# Patient Record
Sex: Male | Born: 1980 | Race: White | Hispanic: No | Marital: Married | Smoking: Never smoker
Health system: Southern US, Community
[De-identification: ages and names within clinical notes are randomized; demographics above are authoritative.]

## PROBLEM LIST (undated history)

## (undated) DIAGNOSIS — R569 Unspecified convulsions: Secondary | ICD-10-CM

---

## 2001-01-18 ENCOUNTER — Ambulatory Visit (HOSPITAL_BASED_OUTPATIENT_CLINIC_OR_DEPARTMENT_OTHER): Admission: RE | Admit: 2001-01-18 | Discharge: 2001-01-18 | Payer: Self-pay | Admitting: *Deleted

## 2005-10-28 ENCOUNTER — Emergency Department: Payer: Self-pay | Admitting: Emergency Medicine

## 2007-09-05 ENCOUNTER — Emergency Department: Payer: Self-pay | Admitting: Emergency Medicine

## 2012-08-01 ENCOUNTER — Emergency Department: Payer: Self-pay | Admitting: *Deleted

## 2012-08-01 LAB — CK TOTAL AND CKMB (NOT AT ARMC)
CK, Total: 171 U/L (ref 35–232)
CK-MB: 1.5 ng/mL (ref 0.5–3.6)

## 2012-08-01 LAB — BASIC METABOLIC PANEL
BUN: 12 mg/dL (ref 7–18)
Calcium, Total: 9.2 mg/dL (ref 8.5–10.1)
Chloride: 104 mmol/L (ref 98–107)
Creatinine: 1.1 mg/dL (ref 0.60–1.30)
EGFR (African American): >60
EGFR (Non-African Amer.): >60
Glucose: 79 mg/dL (ref 65–99)
Osmolality: 276 (ref 275–301)
Potassium: 3.8 mmol/L (ref 3.5–5.1)
Sodium: 139 mmol/L (ref 136–145)

## 2012-08-01 LAB — CBC
MCH: 31.3 pg (ref 26.0–34.0)
MCV: 88 fL (ref 80–100)
Platelet: 260 10*3/uL (ref 150–440)
RBC: 4.94 10*6/uL (ref 4.40–5.90)
RDW: 13.2 % (ref 11.5–14.5)

## 2019-12-22 ENCOUNTER — Other Ambulatory Visit: Payer: Self-pay

## 2019-12-22 ENCOUNTER — Ambulatory Visit
Admission: EM | Admit: 2019-12-22 | Discharge: 2019-12-22 | Disposition: A | Discharge status: Home / Self Care | Payer: Managed Care, Other (non HMO)

## 2019-12-22 DIAGNOSIS — R21 Rash and other nonspecific skin eruption: Secondary | ICD-10-CM | POA: Diagnosis not present

## 2019-12-22 DIAGNOSIS — B372 Candidiasis of skin and nail: Secondary | ICD-10-CM

## 2019-12-22 MED ORDER — NYSTATIN-TRIAMCINOLONE 100000-0.1 UNIT/GM-% EX CREA: TOPICAL_CREAM | CUTANEOUS | 0 refills | Status: DC

## 2019-12-22 NOTE — ED Triage Notes (Signed)
Pt presents with c/o rash in the groin and inner thigh area. He states symptoms have been present for about 6 weeks. He states he has tried OTC antifungal creams with some improvement. He denies any other symptoms such as urinary. He denies any oozing or blisters. He does report the rash is more prominent and sensitive after he has been active and gotten hot. He states the area itches and burns.

## 2019-12-22 NOTE — Discharge Instructions (Addendum)
It was very nice seeing you today in clinic. Thank you for entrusting me with your care.   Use prescribed cream as discussed. During the day, would also suggest using Gold Bond Powder to ensure that groin is staying as dry as possible.   Make arrangements to follow up with your regular doctor in 1 week for re-evaluation if not improving. If your symptoms/condition worsens, please seek follow up care either here or in the ER. Please remember, our [facility] providers are "right here with you" when you need [place].   Again, it was my pleasure to take care of you today. Thank you for choosing our clinic. I hope that you start to feel better quickly.   Quentin Mulling, MSN, APRN, FNP-C, CEN Advanced Practice Provider  [facility]

## 2019-12-23 NOTE — ED Provider Notes (Signed)
[place], [place]   Name: Jeffery Cooper DOB: 1981/08/22 MRN: [id number] CSN: [id number] PCP: Patient, No Pcp Per  Arrival date and time:  12/22/19 1202  Chief Complaint:  Rash   NOTE: Prior to seeing the patient today, I have reviewed the triage nursing documentation and vital signs. Clinical staff has updated patient's PMH/PSHx, current medication list, and drug allergies/intolerances to ensure comprehensive history available to assist in medical decision making.   History:   HPI: Jeffery Cooper is a 39 y.o. male who presents today with complaints of rash to his groin that initially declared around the first week in December. Rash is reported to be in the creases of his legs BILATERALLY. He notes that the rash is erythematous, pruritic, and "burns" when wet. He endorses that due to his job, his groin area is consistently moist. He is required to wear pants that he reports "do not breathe". Rash is improved in the evenings after bathing, however when he is active/gets hot, the rash worsens again to the point where it causes him pain when he walks. In efforts to conservatively manage his symptoms at home, the patient notes that he has used Lortimin, which is reported to only minimally help to improve his symptoms.   History reviewed. No pertinent past medical history.  History reviewed. No pertinent surgical history.  Family History  Problem Relation Age of Onset  . Clotting disorder Mother   . Hypertension Father   . Stroke Father   . Heart attack Father     Social History   Tobacco Use  . Smoking status: Never Smoker  . Smokeless tobacco: Never Used  Substance Use Topics  . Alcohol use: Not Currently  . Drug use: Never    There are no problems to display for this patient.   Home Medications:    Current Meds  Medication Sig  . cetirizine (ZYRTEC) 10 MG tablet Take 10 mg by mouth daily.    Allergies:   Patient has no known allergies.  Review of Systems  (ROS): Review of Systems  Constitutional: Negative for chills and fever.  Respiratory: Negative for cough and shortness of breath.   Cardiovascular: Negative for chest pain and palpitations.  Genitourinary: Negative for discharge, dysuria, flank pain, frequency, genital sores, hematuria, penile pain, penile swelling, scrotal swelling, testicular pain and urgency.  Skin: Positive for color change and rash. Negative for pallor.  All other systems reviewed and are negative.    Vital Signs: Today's Vitals   12/22/19 1222 12/22/19 1225 12/22/19 1313  BP:  117/82   Pulse:  75   Temp:  98.5 F (36.9 C)   TempSrc:  Oral   SpO2:  98%   Weight: 240 lb (108.9 kg)    Height: 5\' 11"  (1.803 m)    PainSc: 0-No pain  0-No pain    Physical Exam: Physical Exam  Constitutional: He is oriented to person, place, and time and well-developed, well-nourished, and in no distress.  HENT:  Head: Normocephalic and atraumatic.  Mouth/Throat: Mucous membranes are normal.  Eyes: Pupils are equal, round, and reactive to light.  Cardiovascular: Normal rate, regular rhythm, normal heart sounds and intact distal pulses.  Pulmonary/Chest: Effort normal and breath sounds normal. No respiratory distress. He has no wheezes. He has no rales.  Musculoskeletal:     Cervical back: Normal range of motion and neck supple.  Neurological: He is alert and oriented to person, place, and time. Gait normal.  Skin: Skin is warm and  dry. Rash noted.  Intertriginous skin irritation noted to groin and beneath scrotum. No satellite lesions. Skin significantly erythematous and TTP. No drainage.   Psychiatric: Mood, memory, affect and judgment normal.  Nursing note and vitals reviewed.   Urgent Care Treatments / Results:   No orders of the defined types were placed in this encounter.   LABS: PLEASE NOTE: all labs that were ordered this encounter are listed, however only abnormal results are displayed. Labs Reviewed - No data  to display  EKG: -None  RADIOLOGY: No results found.  PROCEDURES: Procedures  MEDICATIONS RECEIVED THIS VISIT: Medications - No data to display  PERTINENT CLINICAL COURSE NOTES/UPDATES:   Initial Impression / Assessment and Plan / Urgent Care Course:  Pertinent labs & imaging results that were available during my care of the patient were personally reviewed by me and considered in my medical decision making (see lab/imaging section of note for values and interpretations).  Jeffery Cooper is a 39 y.o. male who presents to [facility] today with complaints of Rash   Patient is well appearing overall in clinic today. He does not appear to be in any acute distress. Presenting symptoms (see HPI) and exam as documented above. Exam consistent with intertriginous candidiasis. Skin very very inflamed and causing patient pain with ambulation. Discussed etiology as being related to moisture. Encouraged patient to use Gold Bond powder in efforts to promote dryness in the groin area. Discussed consideration of changing type of underwear that he wears to something that will prevent skin to skin contact. Will treat acute rash and irritation with antifungal/steroid topical (mycolog II) BID until healed.   Discussed follow up with primary care physician in 1 week for re-evaluation if not improving. I have reviewed the follow up and strict return precautions for any new or worsening symptoms. Patient is aware of symptoms that would be deemed urgent/emergent, and would thus require further evaluation either here or in the emergency department. At the time of discharge, he verbalized understanding and consent with the discharge plan as it was reviewed with him. All questions were fielded by provider and/or clinic staff prior to patient discharge.    Final Clinical Impressions / Urgent Care Diagnoses:   Final diagnoses:  Intertriginous candidiasis    New Prescriptions:  [place] Controlled Substance  Registry consulted? Not Applicable  Meds ordered this encounter  Medications  . nystatin-triamcinolone (MYCOLOG II) cream    Sig: Apply to affected area TWICE daily    Dispense:  30 g    Refill:  0    Recommended Follow up Care:  Patient encouraged to follow up with the following provider within the specified time frame, or sooner as dictated by the severity of his symptoms. As always, he was instructed that for any urgent/emergent care needs, he should seek care either here or in the emergency department for more immediate evaluation.  Follow-up Information    PCP In 1 week.   Why: General reassessment of symptoms if not improving        NOTE: This note was prepared using Scientist, clinical (histocompatibility and immunogenetics) along with smaller Lobbyist. Despite my best ability to proofread, there is the potential that transcriptional errors may still occur from this process, and are completely unintentional.    [name], NP 12/23/19 1835

## 2020-10-03 ENCOUNTER — Emergency Department: Payer: No Typology Code available for payment source

## 2020-10-03 ENCOUNTER — Encounter: Payer: Self-pay | Admitting: Emergency Medicine

## 2020-10-03 ENCOUNTER — Emergency Department
Admission: EM | Admit: 2020-10-03 | Discharge: 2020-10-03 | Disposition: A | Discharge status: Home / Self Care | Payer: No Typology Code available for payment source | Attending: Emergency Medicine | Admitting: Emergency Medicine

## 2020-10-03 ENCOUNTER — Other Ambulatory Visit: Payer: Self-pay

## 2020-10-03 DIAGNOSIS — W231XXA Caught, crushed, jammed, or pinched between stationary objects, initial encounter: Secondary | ICD-10-CM | POA: Insufficient documentation

## 2020-10-03 DIAGNOSIS — Y9269 Other specified industrial and construction area as the place of occurrence of the external cause: Secondary | ICD-10-CM | POA: Insufficient documentation

## 2020-10-03 DIAGNOSIS — Y990 Civilian activity done for income or pay: Secondary | ICD-10-CM | POA: Insufficient documentation

## 2020-10-03 DIAGNOSIS — Z91010 Allergy to peanuts: Secondary | ICD-10-CM | POA: Diagnosis not present

## 2020-10-03 DIAGNOSIS — S6991XA Unspecified injury of right wrist, hand and finger(s), initial encounter: Secondary | ICD-10-CM | POA: Diagnosis present

## 2020-10-03 DIAGNOSIS — S61411A Laceration without foreign body of right hand, initial encounter: Secondary | ICD-10-CM | POA: Insufficient documentation

## 2020-10-03 DIAGNOSIS — S61421A Laceration with foreign body of right hand, initial encounter: Secondary | ICD-10-CM

## 2020-10-03 DIAGNOSIS — Z23 Encounter for immunization: Secondary | ICD-10-CM | POA: Diagnosis not present

## 2020-10-03 MED ORDER — LIDOCAINE HCL (PF) 1 % IJ SOLN
10.0000 mL | Freq: Once | INTRAMUSCULAR | Status: AC
  Administered 2020-10-03: 10 mL
  Filled 2020-10-03: qty 10

## 2020-10-03 MED ORDER — TETANUS-DIPHTH-ACELL PERTUSSIS 5-2.5-18.5 LF-MCG/0.5 IM SUSP
0.5000 mL | Freq: Once | INTRAMUSCULAR | Status: AC
  Administered 2020-10-03: 0.5 mL via INTRAMUSCULAR
  Filled 2020-10-03: qty 0.5

## 2020-10-03 MED ORDER — OXYCODONE-ACETAMINOPHEN 5-325 MG PO TABS
1.0000 | ORAL_TABLET | Freq: Once | ORAL | Status: AC
  Administered 2020-10-03: 1 via ORAL
  Filled 2020-10-03: qty 1

## 2020-10-03 MED ORDER — OXYCODONE-ACETAMINOPHEN 5-325 MG PO TABS
1.0000 | ORAL_TABLET | ORAL | 0 refills | Status: DC | PRN
Start: 2020-10-03 — End: 2021-12-16

## 2020-10-03 MED ORDER — CEFAZOLIN SODIUM 1 G IJ SOLR
1.0000 g | Freq: Once | INTRAMUSCULAR | Status: AC
  Administered 2020-10-03: 1 g via INTRAMUSCULAR
  Filled 2020-10-03: qty 10

## 2020-10-03 MED ORDER — CEPHALEXIN 500 MG PO CAPS: 500.0000 mg | ORAL_CAPSULE | Freq: Three times a day (TID) | ORAL | 0 refills | Status: AC

## 2020-10-03 MED ORDER — IBUPROFEN 800 MG PO TABS
800.0000 mg | ORAL_TABLET | Freq: Once | ORAL | Status: AC
  Administered 2020-10-03: 800 mg via ORAL
  Filled 2020-10-03: qty 1

## 2020-10-03 NOTE — Discharge Instructions (Signed)
Keep laceration dry and clean. Wash with warm water and soap. Apply topical bacitracin.  You received 11 stitches that must be removed in 7 days.  Watch for signs of infection: pus, redness of the skin surrounding it, or fever. If these develop see your doctor or return to the ER for antibiotics.  Follow-up with Jeffery Cooper in 2 days for reevaluation of your tendon laceration.  Take the antibiotics as prescribed.

## 2020-10-03 NOTE — ED Triage Notes (Signed)
Patient states that he was at work and was cleaning a conveyer belt and a rag was wrapped around his hand and got caught in the conveyer belt. Patient with laceration to left hand. Bleeding uncontrolled at this time.

## 2020-10-03 NOTE — ED Notes (Signed)
Pt placed in room. Hand wrapped in gauze.

## 2020-10-03 NOTE — ED Provider Notes (Addendum)
[facility] Emergency Department Provider Note  ____________________________________________  Time seen: Approximately 3:33 AM  I have reviewed the triage vital signs and the nursing notes.   HISTORY  Chief Complaint Laceration   HPI ROE WILNER is a 39 y.o. male with a history of anxiety, and elevated BMI who presents for evaluation of hand laceration.  Patient is right-handed and cut his hand on  a conveyor belt.  Patient reports that he had a rag around his hand which got caught in the conveyor belt and pulled his hand in.  Last tetanus shot was greater than 10 years ago.  Patient had a latex glove on his hand as well.  Patient is complaining of severe throbbing burning pain which has been constant since the accident happened just prior to arrival.  No other injuries.  PMH Anxiety Elevated BMI  Prior to Admission medications   Medication Sig Start Date End Date Taking? Authorizing Provider  cephALEXin (KEFLEX) 500 MG capsule Take 1 capsule (500 mg total) by mouth 3 (three) times daily for 7 days. 10/03/20 10/10/20  [name], MD  cetirizine (ZYRTEC) 10 MG tablet Take 10 mg by mouth daily.    [provider]  nystatin-triamcinolone (MYCOLOG II) cream Apply to affected area TWICE daily 12/22/19   [name], NP  oxyCODONE-acetaminophen (PERCOCET) 5-325 MG tablet Take 1 tablet by mouth every 4 (four) hours as needed. 10/03/20   [name], MD    Allergies Mustard seed and Peanut-containing drug products  Family History  Problem Relation Age of Onset  . Clotting disorder Mother   . Hypertension Father   . Stroke Father   . Heart attack Father     Social History Social History   Tobacco Use  . Smoking status: Never Smoker  . Smokeless tobacco: Never Used  Vaping Use  . Vaping Use: Never used  Substance Use Topics  . Alcohol use: Not Currently  . Drug use: Never    Review of Systems  Constitutional:  Negative for fever. Eyes: Negative for visual changes. ENT: Negative for sore throat. Neck: No neck pain  Cardiovascular: Negative for chest pain. Respiratory: Negative for shortness of breath. Gastrointestinal: Negative for abdominal pain, vomiting or diarrhea. Genitourinary: Negative for dysuria. Musculoskeletal: Negative for back pain. + hand laceration Skin: Negative for rash.  Neurological: Negative for headaches, weakness or numbness. Psych: No SI or HI  ____________________________________________   PHYSICAL EXAM:  VITAL SIGNS: ED Triage Vitals  Enc Vitals Group     BP 10/03/20 0047 114/75     Pulse Rate 10/03/20 0047 84     Resp 10/03/20 0047 (!) 22     Temp 10/03/20 0047 97.7 F (36.5 C)     Temp Source 10/03/20 0047 Oral     SpO2 10/03/20 0047 94 %     Weight 10/03/20 0045 250 lb (113.4 kg)     Height 10/03/20 0045 5\' 10"  (1.778 m)     Head Circumference --      Peak Flow --      Pain Score 10/03/20 0045 2     Pain Loc --      Pain Edu? --      Excl. in GC? --     Constitutional: Alert and oriented. Well appearing and in no apparent distress. HEENT:      Head: Normocephalic and atraumatic.         Eyes: Conjunctivae are normal. Sclera is non-icteric.  Mouth/Throat: Mucous membranes are moist.       Neck: Supple with no signs of meningismus. Cardiovascular: Regular rate and rhythm.  Respiratory: Normal respiratory effort.  Musculoskeletal: Laceration located on the dorsum of the R hand and dorsum of the 3rd digit, with pieces of latex glove in it. 3rd extensor digitorum tendon was cut and retracted into the hand. Patient able to flex and extend DIP, PIP, and MCP. Intact brisk capillary refill. Neurologic: Normal speech and language. Face is symmetric. Moving all extremities. No gross focal neurologic deficits are appreciated. Skin: Skin is warm, dry and intact. No rash noted. Psychiatric: Mood and affect are normal. Speech and behavior are  normal.  ____________________________________________   LABS (all labs ordered are listed, but only abnormal results are displayed)  Labs Reviewed - No data to display ____________________________________________  EKG  none  ____________________________________________  RADIOLOGY  I have personally reviewed the images performed during this visit and I agree with the Radiologist's read.   Interpretation by Radiologist:  DG Hand Complete Right  Result Date: 10/03/2020 CLINICAL DATA:  Laceration to the posterior hand after injury. EXAM: RIGHT HAND - COMPLETE 3+ VIEW COMPARISON:  None. FINDINGS: Soft tissue irregularity and defect around the dorsum of the right hand consistent with laceration. No radiopaque soft tissue foreign bodies. Bones appear intact. No evidence of acute fracture or dislocation. IMPRESSION: Soft tissue laceration. No radiopaque soft tissue foreign bodies. No acute bony abnormalities. Electronically Signed   By: Burman NievesWilliam  Stevens M.D.   On: 10/03/2020 03:52      ____________________________________________   PROCEDURES  Procedure(s) performed:yes .Marland Kitchen.Laceration Repair  Date/Time: 10/03/2020 3:38 AM Performed by: Nita SickleVeronese, [place], MD Authorized by: Nita SickleVeronese, [place], MD   Consent:    Consent obtained:  Verbal   Consent given by:  Patient   Risks discussed:  Infection, pain, retained foreign body, poor cosmetic result, poor wound healing, need for additional repair, tendon damage, vascular damage and nerve damage Anesthesia (see MAR for exact dosages):    Anesthesia method:  Local infiltration   Local anesthetic:  Lidocaine 1% w/o epi Laceration details:    Location:  Hand   Hand location:  R hand, dorsum   Length (cm):  5 Repair type:    Repair type:  Complex Pre-procedure details:    Preparation:  Patient was prepped and draped in usual sterile fashion and imaging obtained to evaluate for foreign bodies Exploration:    Hemostasis achieved with:   Direct pressure and tied off vessels   Wound exploration: wound explored through full range of motion and entire depth of wound probed and visualized     Wound extent: foreign bodies/material, muscle damage and vascular damage     Wound extent: no tendon damage noted and no underlying fracture noted     Foreign bodies/material:  Latex glove pieces   Contaminated: yes   Treatment:    Area cleansed with:  Saline and Betadine   Amount of cleaning:  Extensive   Irrigation solution:  Sterile saline   Visualized foreign bodies/material removed: no   Skin repair:    Repair method:  Sutures   Suture size:  4-0   Suture material:  Nylon   Suture technique:  Simple interrupted   Number of sutures:  5 Approximation:    Approximation:  Close Post-procedure details:    Dressing:  Sterile dressing   Patient tolerance of procedure:  Tolerated well, no immediate complications .Marland Kitchen.Laceration Repair  Date/Time: 10/03/2020 3:40 AM Performed by: Nita SickleVeronese, [place], MD  Authorized by: [name], MD   Consent:    Consent obtained:  Verbal   Consent given by:  Patient   Risks discussed:  Infection, pain, retained foreign body, poor cosmetic result, poor wound healing, tendon damage, vascular damage, need for additional repair and nerve damage Anesthesia (see MAR for exact dosages):    Anesthesia method:  Local infiltration   Local anesthetic:  Lidocaine 1% w/o epi Laceration details:    Location:  Hand   Hand location:  R hand, dorsum   Length (cm):  7 Repair type:    Repair type:  Complex Pre-procedure details:    Preparation:  Patient was prepped and draped in usual sterile fashion and imaging obtained to evaluate for foreign bodies Exploration:    Hemostasis achieved with:  Direct pressure   Wound exploration: wound explored through full range of motion and entire depth of wound probed and visualized     Wound extent: foreign bodies/material and tendon damage     Wound extent: no  underlying fracture noted and no vascular damage noted     Foreign bodies/material:  Pieces of latex glove   Tendon damage location:  Upper extremity   Upper extremity tendon damage location:  Finger extensor   Finger extensor tendon:  Extensor digitorum   Tendon damage extent:  Complete transection   Tendon repair plan:  Refer for evaluation   Contaminated: yes   Treatment:    Area cleansed with:  Saline and Betadine   Amount of cleaning:  Extensive   Irrigation solution:  Sterile saline   Visualized foreign bodies/material removed: no   Skin repair:    Repair method:  Sutures   Suture size:  5-0   Suture material:  Nylon   Suture technique:  Simple interrupted   Number of sutures:  6 Approximation:    Approximation:  Close Post-procedure details:    Dressing:  Sterile dressing   Patient tolerance of procedure:  Tolerated well, no immediate complications   Critical Care performed:  None ____________________________________________   INITIAL IMPRESSION / ASSESSMENT AND PLAN / ED COURSE  39 y.o. male with a history of anxiety, and elevated BMI who presents for evaluation of hand laceration.  Patient sustained two lacerations to the dorsum of the right hand.  They were both repaired per procedure note above.  The 1st one was 5 cm long located over his hand with pieces of latex glove, a bleeding arterial which was tied off, and patient received five stitches.  The other one was closer to the base of the 3rd digit also with pieces of latex glove, no arterial damage but patient had the extensor digitorum tendon which was transected completely.  Unfortunately was unable to be repaired as it was retracted into the hand.  X-ray of the hand visualized by me with no acute fracture dislocation, confirmed by radiology I discussed this finding with patient and I am referring him to Dr. Stephenie Acres at [facility] for further evaluation.  Hand was otherwise neurovascularly intact on exam.  Patient was  given a dose of Ancef and a tetanus booster.  Discussed wound care with him and his wife was at bedside and close follow-up.  Patient be discharged home on Keflex.      _____________________________________________ Please note:  Patient was evaluated in Emergency Department today for the symptoms described in the history of present illness. Patient was evaluated in the context of the global COVID-19 pandemic, which necessitated consideration that the patient might be at risk  for infection with the SARS-CoV-2 virus that causes COVID-19. Institutional protocols and algorithms that pertain to the evaluation of patients at risk for COVID-19 are in a state of rapid change based on information released by regulatory bodies including the CDC and federal and state organizations. These policies and algorithms were followed during the patient's care in the ED.  Some ED evaluations and interventions may be delayed as a result of limited staffing during the pandemic.   [place] Controlled Substance Database was reviewed by me. ____________________________________________   FINAL CLINICAL IMPRESSION(S) / ED DIAGNOSES   Final diagnoses:  Laceration of right hand with foreign body, initial encounter      NEW MEDICATIONS STARTED DURING THIS VISIT:  ED Discharge Orders         Ordered    cephALEXin (KEFLEX) 500 MG capsule  3 times daily        10/03/20 0348    oxyCODONE-acetaminophen (PERCOCET) 5-325 MG tablet  Every 4 hours PRN        10/03/20 0348           Note:  This document was prepared using Dragon voice recognition software and may include unintentional dictation errors.    Don Perking, [place], MD 10/03/20 8469    [name], MD 10/03/20 [phone]

## 2020-10-18 ENCOUNTER — Other Ambulatory Visit (HOSPITAL_COMMUNITY): Payer: Self-pay | Admitting: Neurology

## 2020-10-18 ENCOUNTER — Other Ambulatory Visit: Payer: Self-pay | Admitting: Neurology

## 2020-10-18 DIAGNOSIS — G47411 Narcolepsy with cataplexy: Secondary | ICD-10-CM

## 2020-11-05 ENCOUNTER — Other Ambulatory Visit: Payer: Self-pay

## 2020-11-05 ENCOUNTER — Ambulatory Visit
Admission: RE | Admit: 2020-11-05 | Discharge: 2020-11-05 | Disposition: A | Discharge status: Home / Self Care | Payer: Managed Care, Other (non HMO) | Source: Ambulatory Visit | Attending: Neurology | Admitting: Neurology

## 2020-11-05 DIAGNOSIS — G47411 Narcolepsy with cataplexy: Secondary | ICD-10-CM

## 2020-11-05 MED ORDER — GADOBUTROL 1 MMOL/ML IV SOLN
10.0000 mL | Freq: Once | INTRAVENOUS | Status: AC | PRN
  Administered 2020-11-05: 10 mL via INTRAVENOUS

## 2021-02-05 IMAGING — MR MR HEAD WO/W CM
12 series · 48 of 48 positions shown · IV contrast (gadavist)
Comparison: Report of head CT 11/12/2000 (no images available).

CLINICAL DATA: 39-year-old male with Cataplexy, loses control of
left leg when excited.

EXAM:
MRI HEAD WITHOUT AND WITH CONTRAST
TECHNIQUE: Multiplanar, multiecho pulse sequences of the brain and surrounding
structures were obtained without and with intravenous contrast.
CONTRAST:  10mL GADAVIST GADOBUTROL 1 MMOL/ML IV SOLN

[Series 5: ax dwi_tracew · axial · 3.0mm · 0.71mm/px · z∈[-101,+64]mm · 7 of 112 slices shown]
[im 1/112]
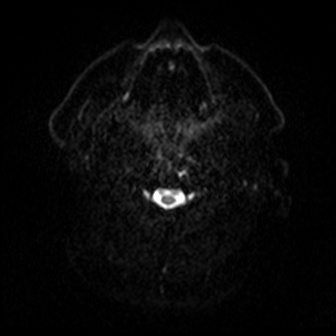
[im 19/112]
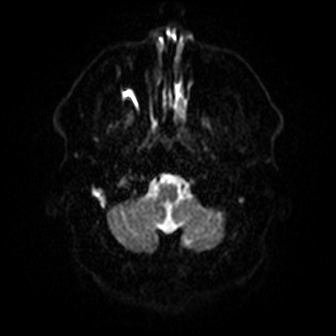
[im 38/112]
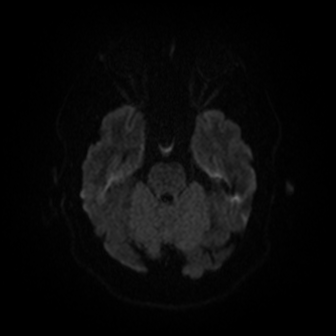
[im 56/112]
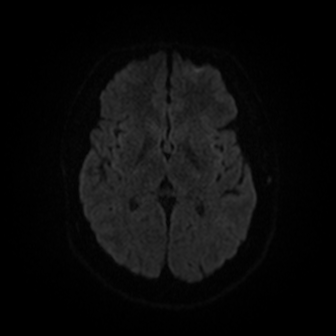
[im 75/112]
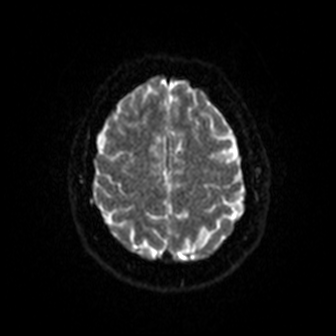
[im 93/112]
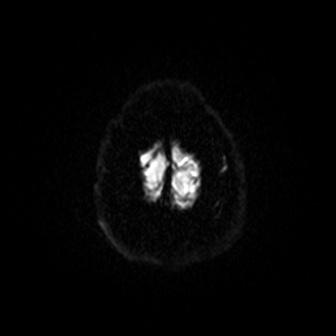
[im 112/112]
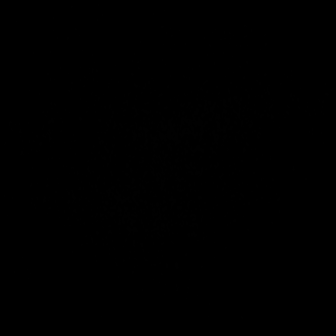

[Series 6: ax dwi_adc · axial · 3.0mm · 0.71mm/px · z∈[-101,+58]mm · 3 of 54 slices shown]
[im 1/54]
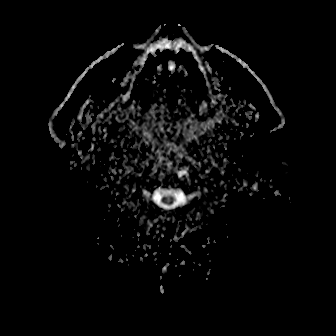
[im 27/54]
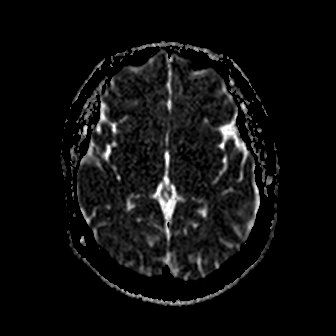
[im 54/54]
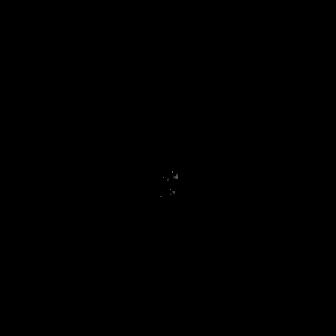

[Series 7: cor dwi_tracew · coronal · 5.0mm · 0.68mm/px · 5 of 80 slices shown]
[im 1/80]
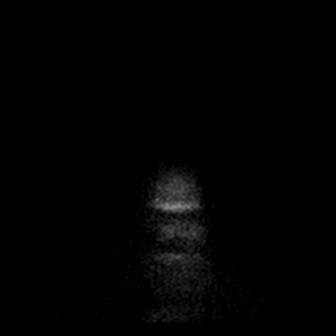
[im 20/80]
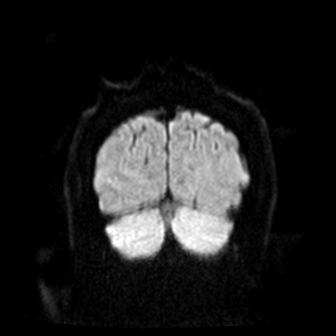
[im 40/80]
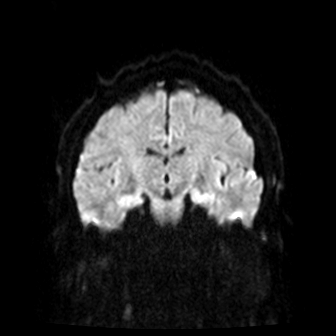
[im 60/80]
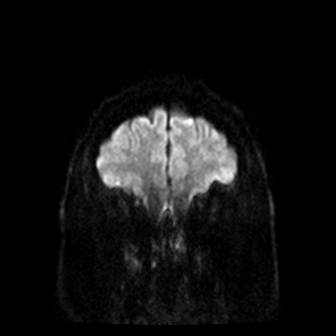
[im 80/80]
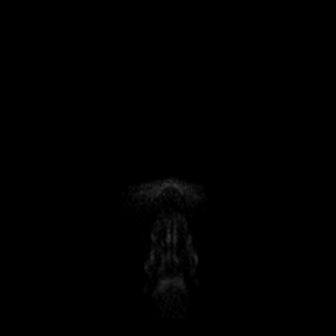

[Series 8: cor dwi_adc · coronal · 5.0mm · 0.68mm/px · 3 of 40 slices shown]
[im 1/40]
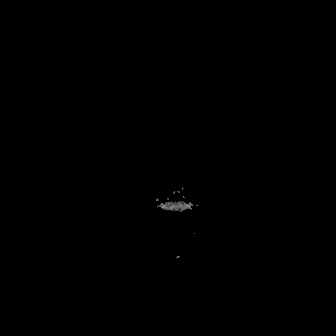
[im 20/40]
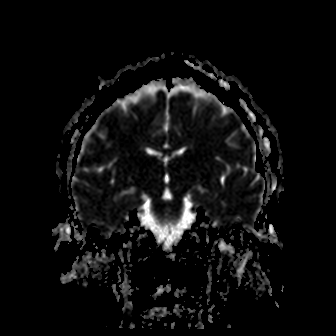
[im 40/40]
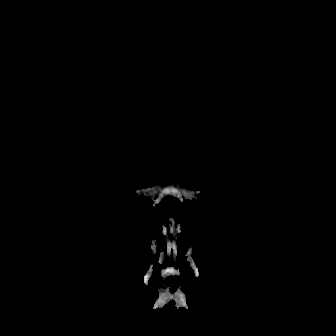

[Series 9: T1 · sagittal · 5.0mm · 0.94mm/px · 2 of 25 slices shown (1 of 2)]
[im 1/25]
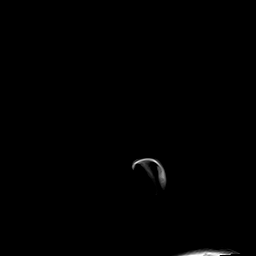
[im 25/25]
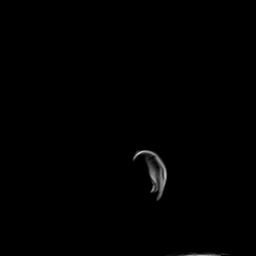

[Series 11: mag_images · axial · 3.0mm · 0.90mm/px · z∈[-107,+69]mm · 4 of 60 slices shown]
[im 1/60]
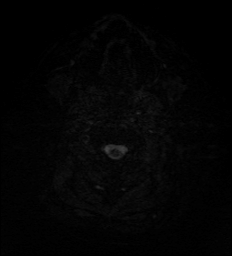
[im 20/60]
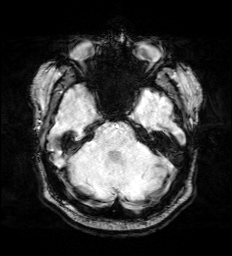
[im 40/60]
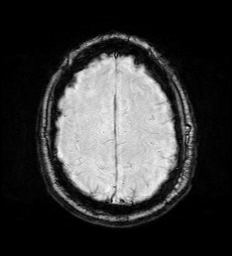
[im 60/60]
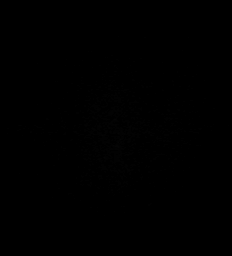

[Series 13: swi_images · axial · 3.0mm · 0.90mm/px · z∈[-107,+69]mm · 4 of 60 slices shown]
[im 1/60]
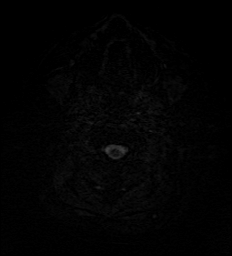
[im 20/60]
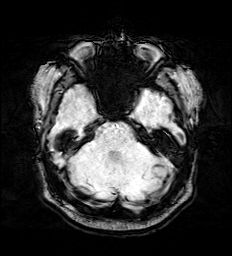
[im 40/60]
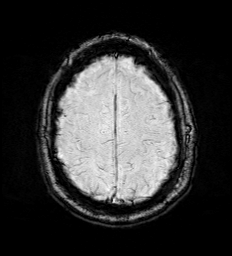
[im 60/60]
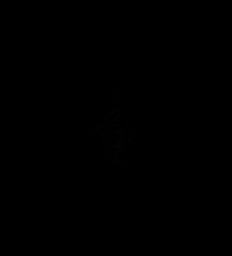

[Series 14: mip_images(sw) · axial · 24.0mm · 0.90mm/px · z∈[-97,+59]mm · 3 of 53 slices shown]
[im 1/53]
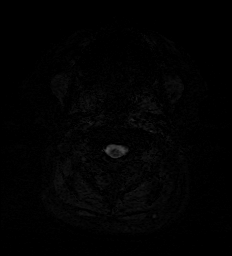
[im 27/53]
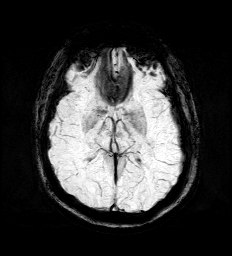
[im 53/53]
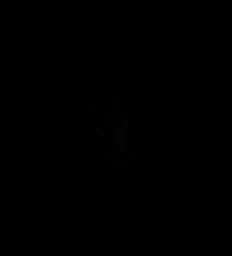

[Series 15: FLAIR · axial · 3.0mm · 0.53mm/px · z∈[-100,+61]mm · 3 of 55 slices shown]
[im 1/55]
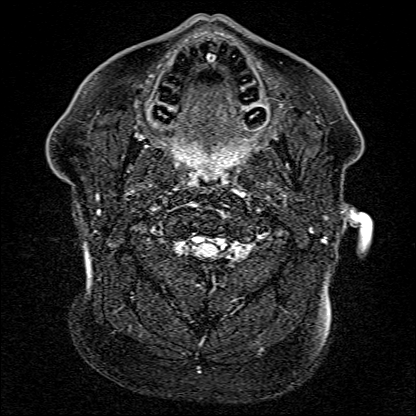
[im 28/55]
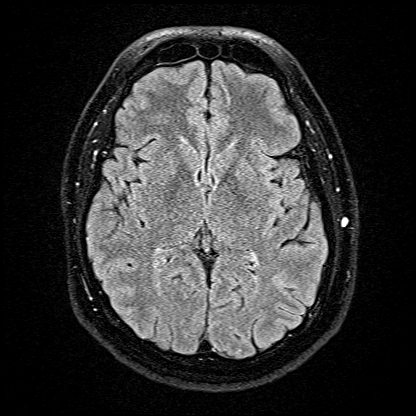
[im 55/55]
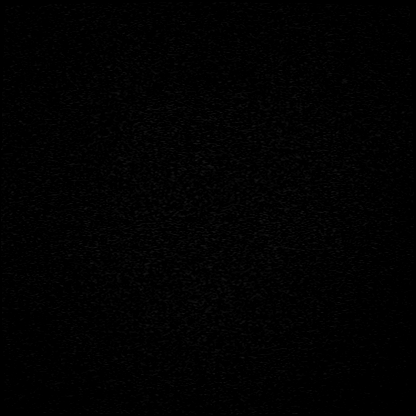

[Series 16: T1 · axial · 1.0mm · 0.98mm/px · z∈[-97,+61]mm · 10 of 160 slices shown (2 of 2)]
[im 1/160]
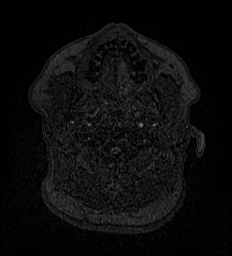
[im 18/160]
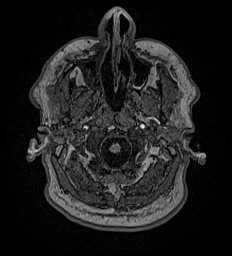
[im 36/160]
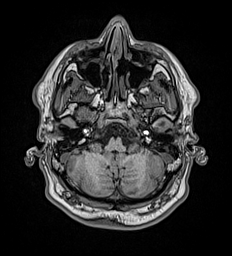
[im 54/160]
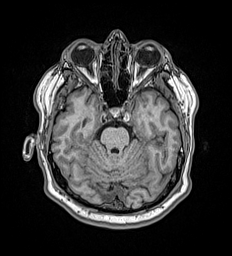
[im 71/160]
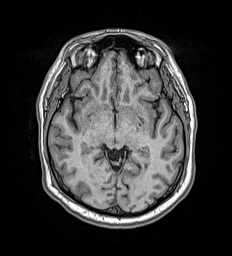
[im 89/160]
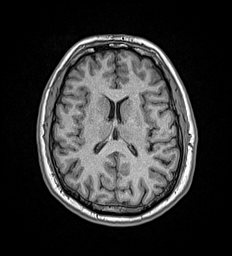
[im 107/160]
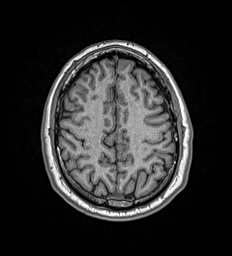
[im 124/160]
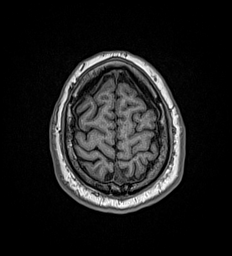
[im 142/160]
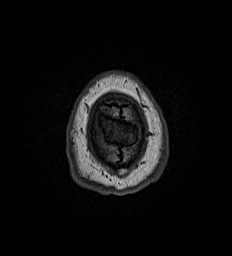
[im 160/160]
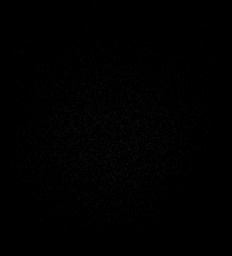

[Series 17: T2 post-contrast · coronal · 5.0mm · 0.57mm/px · 2 of 29 slices shown]
[im 1/29]
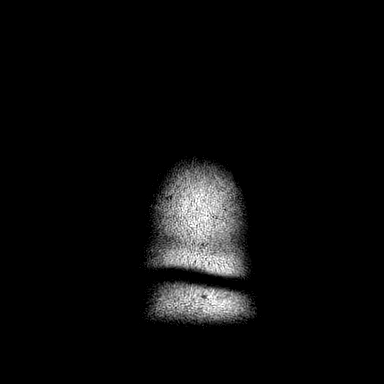
[im 29/29]
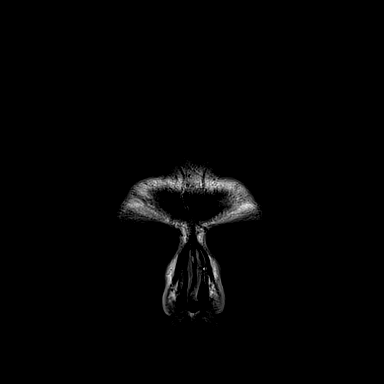

[Series 19: T1 post-contrast · coronal · 5.0mm · 0.57mm/px · 2 of 29 slices shown]
[im 1/29]
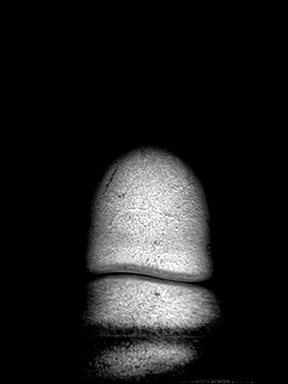
[im 29/29]
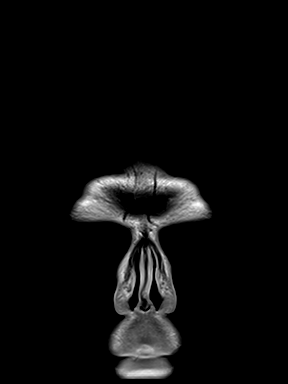

[48 of 48 positions shown; findings below may reference images not displayed]

FINDINGS: Brain: Cerebral volume is within normal limits. No restricted
diffusion to suggest acute infarction. No midline shift, mass
effect, evidence of mass lesion, ventriculomegaly, extra-axial
collection or acute intracranial hemorrhage. Cervicomedullary
junction and pituitary are within normal limits.

Gray and white matter signal is within normal limits throughout the
brain. No encephalomalacia or chronic cerebral blood products
identified.

No abnormal enhancement identified.  No dural thickening.

Vascular: Major intracranial vascular flow voids are preserved. The
major dural venous sinuses are enhancing and appear to be patent.

Skull and upper cervical spine: Normal visible cervical spine.
Normal bone marrow signal.

Sinuses/Orbits: Negative orbits. Mild to moderate right maxillary
sinus mucosal thickening and/or mucous retention cyst. Trace
paranasal sinus mucosal thickening elsewhere.

Other: Mild bilateral mastoid air cell fluid. Negative visible
nasopharynx. Other visible internal auditory structures appear
grossly normal. Visible scalp and face soft tissues appear negative.
IMPRESSION: 1. Normal MRI appearance of the brain.
2. Mild to moderate mucosal thickening in the right maxillary sinus.
Mild bilateral mastoid air cell fluid which is likely
postinflammatory. These appear inconsequential.

## 2021-06-30 ENCOUNTER — Emergency Department: Payer: 59 | Admitting: Anesthesiology

## 2021-06-30 ENCOUNTER — Encounter
Admission: EM | Disposition: A | Discharge status: Home / Self Care | Payer: Self-pay | Source: Home / Self Care | Attending: Emergency Medicine

## 2021-06-30 ENCOUNTER — Other Ambulatory Visit: Payer: Self-pay

## 2021-06-30 ENCOUNTER — Encounter: Payer: Self-pay | Admitting: Emergency Medicine

## 2021-06-30 ENCOUNTER — Ambulatory Visit
Admission: EM | Admit: 2021-06-30 | Discharge: 2021-07-01 | Disposition: A | Discharge status: Home / Self Care | Payer: 59 | Attending: Gastroenterology | Admitting: Gastroenterology

## 2021-06-30 ENCOUNTER — Emergency Department: Payer: 59

## 2021-06-30 DIAGNOSIS — E669 Obesity, unspecified: Secondary | ICD-10-CM | POA: Diagnosis not present

## 2021-06-30 DIAGNOSIS — T18128A Food in esophagus causing other injury, initial encounter: Secondary | ICD-10-CM | POA: Insufficient documentation

## 2021-06-30 DIAGNOSIS — Z91018 Allergy to other foods: Secondary | ICD-10-CM | POA: Insufficient documentation

## 2021-06-30 DIAGNOSIS — Z20822 Contact with and (suspected) exposure to covid-19: Secondary | ICD-10-CM | POA: Diagnosis not present

## 2021-06-30 DIAGNOSIS — Z6836 Body mass index (BMI) 36.0-36.9, adult: Secondary | ICD-10-CM | POA: Diagnosis not present

## 2021-06-30 DIAGNOSIS — Z8249 Family history of ischemic heart disease and other diseases of the circulatory system: Secondary | ICD-10-CM | POA: Insufficient documentation

## 2021-06-30 DIAGNOSIS — Z823 Family history of stroke: Secondary | ICD-10-CM | POA: Insufficient documentation

## 2021-06-30 DIAGNOSIS — X58XXXA Exposure to other specified factors, initial encounter: Secondary | ICD-10-CM | POA: Diagnosis not present

## 2021-06-30 DIAGNOSIS — Z91010 Allergy to peanuts: Secondary | ICD-10-CM | POA: Diagnosis not present

## 2021-06-30 DIAGNOSIS — Z832 Family history of diseases of the blood and blood-forming organs and certain disorders involving the immune mechanism: Secondary | ICD-10-CM | POA: Diagnosis not present

## 2021-06-30 DIAGNOSIS — Z79899 Other long term (current) drug therapy: Secondary | ICD-10-CM | POA: Insufficient documentation

## 2021-06-30 DIAGNOSIS — K3189 Other diseases of stomach and duodenum: Secondary | ICD-10-CM | POA: Diagnosis not present

## 2021-06-30 HISTORY — PX: ESOPHAGOGASTRODUODENOSCOPY: SHX5428

## 2021-06-30 LAB — CBC WITH DIFFERENTIAL/PLATELET
Abs Immature Granulocytes: 0.03 10*3/uL (ref 0.00–0.07)
Basophils Absolute: 0.1 10*3/uL (ref 0.0–0.1)
Basophils Relative: 1 %
Eosinophils Absolute: 0.4 10*3/uL (ref 0.0–0.5)
Eosinophils Relative: 7 %
HCT: 43.3 % (ref 39.0–52.0)
Hemoglobin: 15.6 g/dL (ref 13.0–17.0)
Immature Granulocytes: 1 %
Lymphocytes Relative: 35 %
Lymphs Abs: 2.2 10*3/uL (ref 0.7–4.0)
MCH: 32.1 pg (ref 26.0–34.0)
MCHC: 36 g/dL (ref 30.0–36.0)
MCV: 89.1 fL (ref 80.0–100.0)
Monocytes Absolute: 0.6 10*3/uL (ref 0.1–1.0)
Monocytes Relative: 10 %
Neutro Abs: 2.9 10*3/uL (ref 1.7–7.7)
Neutrophils Relative %: 46 %
Platelets: 294 10*3/uL (ref 150–400)
RBC: 4.86 MIL/uL (ref 4.22–5.81)
RDW: 12.4 % (ref 11.5–15.5)
WBC: 6.3 10*3/uL (ref 4.0–10.5)
nRBC: 0 % (ref 0.0–0.2)

## 2021-06-30 LAB — COMPREHENSIVE METABOLIC PANEL
ALT: 49 U/L — ABNORMAL HIGH (ref 0–44)
AST: 30 U/L (ref 15–41)
Albumin: 4.6 g/dL (ref 3.5–5.0)
Alkaline Phosphatase: 66 U/L (ref 38–126)
Anion gap: 8 (ref 5–15)
BUN: 12 mg/dL (ref 6–20)
CO2: 26 mmol/L (ref 22–32)
Calcium: 9.4 mg/dL (ref 8.9–10.3)
Chloride: 104 mmol/L (ref 98–111)
Creatinine, Ser: 1.19 mg/dL (ref 0.61–1.24)
GFR, Estimated: >60 mL/min (ref >60)
Glucose, Bld: 115 mg/dL — ABNORMAL HIGH (ref 70–99)
Potassium: 3.8 mmol/L (ref 3.5–5.1)
Sodium: 138 mmol/L (ref 135–145)
Total Bilirubin: 0.9 mg/dL (ref 0.3–1.2)
Total Protein: 7.9 g/dL (ref 6.5–8.1)

## 2021-06-30 LAB — TROPONIN I (HIGH SENSITIVITY): Troponin I (High Sensitivity): 3 ng/L (ref <18)

## 2021-06-30 LAB — RESP PANEL BY RT-PCR (FLU A&B, COVID) ARPGX2
Influenza A by PCR: NEGATIVE
Influenza B by PCR: NEGATIVE
SARS Coronavirus 2 by RT PCR: NEGATIVE

## 2021-06-30 SURGERY — EGD (ESOPHAGOGASTRODUODENOSCOPY)
Anesthesia: General

## 2021-06-30 SURGERY — ESOPHAGOGASTRODUODENOSCOPY (EGD) WITH PROPOFOL
Anesthesia: Monitor Anesthesia Care

## 2021-06-30 MED ORDER — METHYLPREDNISOLONE SODIUM SUCC 125 MG IJ SOLR
125.0000 mg | Freq: Once | INTRAMUSCULAR | Status: AC
  Administered 2021-06-30: 125 mg via INTRAVENOUS
  Filled 2021-06-30: qty 2

## 2021-06-30 MED ORDER — PROPOFOL 10 MG/ML IV BOLUS
INTRAVENOUS | Status: AC
  Filled 2021-06-30: qty 20

## 2021-06-30 MED ORDER — GLUCAGON HCL (RDNA) 1 MG IJ SOLR
1.0000 mg | Freq: Once | INTRAMUSCULAR | Status: AC
  Administered 2021-06-30: 1 mg via INTRAVENOUS
  Filled 2021-06-30 (×2): qty 1

## 2021-06-30 MED ORDER — FAMOTIDINE IN NACL 20-0.9 MG/50ML-% IV SOLN
20.0000 mg | Freq: Once | INTRAVENOUS | Status: AC
  Administered 2021-06-30: 20 mg via INTRAVENOUS
  Filled 2021-06-30: qty 50

## 2021-06-30 MED ORDER — FENTANYL CITRATE (PF) 100 MCG/2ML IJ SOLN
INTRAMUSCULAR | Status: AC
  Filled 2021-06-30: qty 2

## 2021-06-30 NOTE — ED Triage Notes (Signed)
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  Presents to ED with N/V, shortness of breath, difficulty speaking.  Patient also reports feeling like beef tip is lodged.  EDP, [place] to bedside.

## 2021-06-30 NOTE — ED Notes (Signed)
Pt presents with food in his throat - endorses pain with swallowing - no SOB noted. Pt able to complete full sentences.

## 2021-06-30 NOTE — ED Provider Notes (Signed)
[facility] Emergency Department Provider Note  ____________________________________________   Event Date/Time   First MD Initiated Contact with Patient 06/30/21 2129     (approximate)  I have reviewed the triage vital signs and the nursing notes.   HISTORY  Chief Complaint Allergic Reaction    HPI Jeffery Cooper is a 40 y.o. 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  Patient states that he was eating some beef tips and he feels like he choked on 1 and it got stuck.  He is also concerned that he might of had some mustard powder on it and that he could be having allergic reaction.  His symptoms started just prior to arrival, constant, nothing makes it better, nothing makes it worse.  States that he feels with her something stuck in his chest.  Every time he tries to drink or eat something he vomits.  Denies having a food impaction previously          History reviewed. No pertinent past medical history.  There are no problems to display for this patient.   No past surgical history on file.  Prior to Admission medications   Medication Sig Start Date End Date Taking? Authorizing Provider  cetirizine (ZYRTEC) 10 MG tablet Take 10 mg by mouth daily.    [provider]  nystatin-triamcinolone (MYCOLOG II) cream Apply to affected area TWICE daily 12/22/19   [name], NP  oxyCODONE-acetaminophen (PERCOCET) 5-325 MG tablet Take 1 tablet by mouth every 4 (four) hours as needed. 10/03/20   [name], MD    Allergies Mustard seed and Peanut-containing drug products  Family History  Problem Relation Age of Onset   Clotting disorder Mother    Hypertension Father    Stroke Father    Heart attack Father     Social History Social History   Tobacco Use   Smoking status: Never   Smokeless tobacco: Never  Vaping Use   Vaping Use: Never used  Substance Use  Topics   Alcohol use: Not Currently   Drug use: Never      Review of Systems Constitutional: No fever/chills Eyes: No visual changes. ENT: Feel like something is stuck in his throat Cardiovascular: Denies chest pain. Respiratory: Denies shortness of breath. Gastrointestinal: No abdominal pain.  No nausea, no vomiting.  No diarrhea.  No constipation. Genitourinary: Negative for dysuria. Musculoskeletal: Negative for back pain. Skin: Negative for rash. Neurological: Negative for headaches, focal weakness or numbness. All other ROS negative ____________________________________________   PHYSICAL EXAM:  VITAL SIGNS: ED Triage Vitals  Enc Vitals Group     BP 06/30/21 2131 (!) 156/94     Pulse Rate 06/30/21 2131 64     Resp 06/30/21 2131 20     Temp 06/30/21 2131 98.7 F (37.1 C)     Temp Source 06/30/21 2131 Oral     SpO2 06/30/21 2131 100 %     Weight 06/30/21 2134 253 lb 8.5 oz (115 kg)     Height 06/30/21 2134 5\' 10"  (1.778 m)     Head Circumference --      Peak Flow --      Pain Score 06/30/21 2133 4     Pain Loc --      Pain Edu? --      Excl. in GC? --     Constitutional: Alert and oriented.  Patient appears uncomfortable Eyes: Conjunctivae are normal.  EOMI. Head: Atraumatic. Nose: No congestion/rhinnorhea. Mouth/Throat: Mucous membranes are moist.  No swelling noted of his tongue or lips. Neck: No stridor. Trachea Midline. FROM Cardiovascular: Normal rate, regular rhythm. Grossly normal heart sounds.  Good peripheral circulation. Respiratory: Normal respiratory effort.  No retractions. Lungs CTAB. Gastrointestinal: Soft and nontender. No distention. No abdominal bruits.  Patient is dry heaving Musculoskeletal: No lower extremity tenderness nor edema.  No joint effusions. Neurologic:  Normal speech and language. No gross focal neurologic deficits are appreciated.  Skin:  Skin is warm, dry and intact. No rash noted. Psychiatric: Mood and affect are normal.  Speech and behavior are normal. GU: Deferred   ____________________________________________   LABS (all labs ordered are listed, but only abnormal results are displayed)  Labs Reviewed  COMPREHENSIVE METABOLIC PANEL - Abnormal; Notable for the following components:      Result Value   Glucose, Bld 115 (*)    ALT 49 (*)    All other components within normal limits  RESP PANEL BY RT-PCR (FLU A&B, COVID) ARPGX2  CBC WITH DIFFERENTIAL/PLATELET  TROPONIN I (HIGH SENSITIVITY)   ____________________________________________   ED ECG REPORT I, Concha Se, the attending physician, personally viewed and interpreted this ECG.  Rate of 77, no ST elevation, no T wave inversions, normal intervals ____________________________________________  RADIOLOGY Vela Prose, personally viewed and evaluated these images (plain radiographs) as part of my medical decision making, as well as reviewing the written report by the radiologist.  ED MD interpretation: X-rays are negative  Official radiology report(s): DG Neck Soft Tissue  Result Date: 06/30/2021 CLINICAL DATA:  Food bolus impaction EXAM: NECK SOFT TISSUES - 1+ VIEW COMPARISON:  None. FINDINGS: There is no evidence of retropharyngeal soft tissue swelling or epiglottic enlargement. The cervical airway is unremarkable and no radio-opaque foreign body identified. IMPRESSION: Negative. Electronically Signed   By: Helyn Numbers MD   On: 06/30/2021 22:28   DG Chest 2 View  Result Date: 06/30/2021 CLINICAL DATA:  Food bolus impaction EXAM: CHEST - 2 VIEW COMPARISON:  08/01/2012 FINDINGS: A rounded calcified density is seen within the right paratracheal region subjacent to the medial right clavicle likely representing hypertrophic changes at the first costochondral junction as there is no significant mass effect upon the adjacent pulmonary or tracheal structures. The lungs are otherwise clear. No pneumothorax or pleural effusion. Cardiac size  within normal limits. The pulmonary vascularity is normal. No acute bone abnormality. IMPRESSION: No active cardiopulmonary disease. Electronically Signed   By: Helyn Numbers MD   On: 06/30/2021 22:27    ____________________________________________   PROCEDURES  Procedure(s) performed (including Critical Care):  .1-3 Lead EKG Interpretation  Date/Time: 06/30/2021 10:47 PM Performed by: Concha Se, MD Authorized by: Concha Se, MD     Interpretation: normal     ECG rate:  90s   ECG rate assessment: normal     Rhythm: sinus rhythm     Ectopy: none     Conduction: normal     ____________________________________________   INITIAL IMPRESSION / ASSESSMENT AND PLAN / ED COURSE  MANCE VALLEJO was evaluated in Emergency Department on 06/30/2021 for the symptoms described in the history of present illness. He was evaluated in the context of the global COVID-19 pandemic, which necessitated consideration that the patient might be at risk for infection with the SARS-CoV-2 virus that causes COVID-19. Institutional protocols and algorithms that pertain to the evaluation of patients at risk for COVID-19 are in a state of  rapid change based on information released by regulatory bodies including the CDC and federal and state organizations. These policies and algorithms were followed during the patient's care in the ED.    Initially was concern for potential allergic reaction given it had mustard powder on it so I did give patient some Solu-Medrol and Pepcid.  However patient has no swelling and it seems like every time he tries to drink is when he has the nausea and vomiting and so I suspect this is a higher chance of food impaction.  He reports choking on the beef tips.  X-rays were ordered to make sure there is no evidence of pneumonia, tracheal deviation.  EKG and cardiac marker was ordered to evaluate for ACS.  Patient was given a dose of glucagon and patient was kept on the cardiac monitor  to evaluate for worsening symptoms.  On repeat evaluation patient still spitting up.  Unable to tolerate p.o.  Continue to vomit.  I discussed with Dr. Mia Creek from GI  Patient handed off to oncoming team      ____________________________________________   FINAL CLINICAL IMPRESSION(S) / ED DIAGNOSES   Final diagnoses:  Food impaction of esophagus, initial encounter      MEDICATIONS GIVEN DURING THIS VISIT:  Medications  methylPREDNISolone sodium succinate (SOLU-MEDROL) 125 mg/2 mL injection 125 mg (has no administration in time range)  famotidine (PEPCID) IVPB 20 mg premix (has no administration in time range)  glucagon (GLUCAGEN) injection 1 mg (has no administration in time range)     ED Discharge Orders     None        Note:  This document was prepared using Dragon voice recognition software and may include unintentional dictation errors.    Concha Se, MD 06/30/21 2248

## 2021-06-30 NOTE — Anesthesia Preprocedure Evaluation (Signed)
Anesthesia Evaluation  Patient identified by MRN, date of birth, ID band Patient awake    Reviewed: Allergy & Precautions, NPO status , Patient's Chart, lab work & pertinent test results  History of Anesthesia Complications Negative for: history of anesthetic complications  Airway Mallampati: III  TM Distance: <3 FB Neck ROM: full    Dental  (+) Chipped   Pulmonary neg pulmonary ROS, neg shortness of breath, neg recent URI,    Pulmonary exam normal        Cardiovascular Exercise Tolerance: Good (-) angina(-) Past MI negative cardio ROS Normal cardiovascular exam     Neuro/Psych negative neurological ROS  negative psych ROS   GI/Hepatic Neg liver ROS, GERD  Medicated and Controlled,  Endo/Other  negative endocrine ROS  Renal/GU      Musculoskeletal   Abdominal   Peds  Hematology negative hematology ROS (+)   Anesthesia Other Findings  BMI    Body Mass Index: 36.38 kg/m      Reproductive/Obstetrics negative OB ROS                             Anesthesia Physical Anesthesia Plan  ASA: 3  Anesthesia Plan: General ETT and Rapid Sequence   Post-op Pain Management:    Induction: Intravenous  PONV Risk Score and Plan: Ondansetron, Dexamethasone, Midazolam and Treatment may vary due to age or medical condition  Airway Management Planned: Oral ETT and Video Laryngoscope Planned  Additional Equipment:   Intra-op Plan:   Post-operative Plan: Extubation in OR  Informed Consent: I have reviewed the patients History and Physical, chart, labs and discussed the procedure including the risks, benefits and alternatives for the proposed anesthesia with the patient or authorized representative who has indicated his/her understanding and acceptance.     Dental Advisory Given  Plan Discussed with: Anesthesiologist, CRNA and Surgeon  Anesthesia Plan Comments: (Patient consented for risks  of anesthesia including but not limited to:  - adverse reactions to medications - damage to eyes, teeth, lips or other oral mucosa - nerve damage due to positioning  - sore throat or hoarseness - Damage to heart, brain, nerves, lungs, other parts of body or loss of life  Patient voiced understanding.)        Anesthesia Quick Evaluation

## 2021-06-30 NOTE — Consult Note (Signed)
Consultation  Referring Provider:    Dr. Fuller Plan Consult date  : 06/30/2021       Reason for Consultation:     Food Bolus         HPI:   [facility] is a 40 y.o. gentleman with no significant medical history besides obesity here with food impaction from beef tips. At a large chunk and felt it get stuck. He is tolerating his secretions but has to spit them up occasionally. This has never happened to him before and denies any dysphagia recently. No blood thinners. No history of asthma. No smoking or alcohol use. He works at a [employer].  History reviewed. No pertinent past medical history.  History reviewed. No pertinent surgical history.  Family History  Problem Relation Age of Onset  . Clotting disorder Mother   . Hypertension Father   . Stroke Father   . Heart attack Father    Social History   Tobacco Use  . Smoking status: Never  . Smokeless tobacco: Never  Vaping Use  . Vaping Use: Never used  Substance Use Topics  . Alcohol use: Not Currently  . Drug use: Never    Prior to Admission medications   Medication Sig Start Date End Date Taking? Authorizing Provider  cetirizine (ZYRTEC) 10 MG tablet Take 10 mg by mouth daily.    [provider]  nystatin-triamcinolone (MYCOLOG II) cream Apply to affected area TWICE daily 12/22/19   [name], NP  oxyCODONE-acetaminophen (PERCOCET) 5-325 MG tablet Take 1 tablet by mouth every 4 (four) hours as needed. 10/03/20   [name], MD    No current facility-administered medications for this encounter.   Current Outpatient Medications  Medication Sig Dispense Refill  . cetirizine (ZYRTEC) 10 MG tablet Take 10 mg by mouth daily.    Marland Kitchen nystatin-triamcinolone (MYCOLOG II) cream Apply to affected area TWICE daily 30 g 0  . oxyCODONE-acetaminophen (PERCOCET) 5-325 MG tablet Take 1 tablet by mouth every 4 (four) hours as needed. 12 tablet 0    Allergies as of 06/30/2021 - Review Complete 06/30/2021  Allergen  Reaction Noted  . 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 Peanut-containing drug products  10/03/2020     Review of Systems:    All systems reviewed and negative except where noted in HPI.  Review of Systems  Constitutional:  Negative for chills and fever.  Respiratory:  Positive for shortness of breath.   Cardiovascular:  Positive for chest pain.  Gastrointestinal:  Positive for nausea and vomiting. Negative for blood in stool and melena.  Skin:  Negative for itching and rash.  Neurological:  Negative for focal weakness.  Psychiatric/Behavioral:  Negative for substance abuse.   All other systems reviewed and are negative.     Physical Exam:  Vital signs in last 24 hours: Temp:  [98.7 F (37.1 C)] 98.7 F (37.1 C) (07/21 2131) Pulse Rate:  [64-90] 72 (07/21 2315) Resp:  [20-26] 26 (07/21 2315) BP: (138-156)/(89-97) 138/89 (07/21 2315) SpO2:  [96 %-100 %] 96 % (07/21 2315) Weight:  [735 kg] 115 kg (07/21 2134)   General:   In mild distress Head:  Normocephalic and atraumatic. Eyes:   No icterus.   Conjunctiva pink. Mouth: Mucosa pink moist, no lesions. Neck:  Supple; no masses felt Lungs:  No respiratory distress Heart:  RRR Abdomen:   Non-tender, non-distended Msk:  MAEW x4, No clubbing or cyanosis. Strength 5/5. Symmetrical without gross deformities. Neurologic:  Alert and  oriented x4;  Cranial nerves II-XII intact.  Skin:  Warm, dry, pink without significant lesions or rashes. Psych:  Alert and cooperative. Normal affect.  LAB RESULTS: Recent Labs    06/30/21 2132  WBC 6.3  HGB 15.6  HCT 43.3  PLT 294   BMET Recent Labs    06/30/21 2132  NA 138  K 3.8  CL 104  CO2 26  GLUCOSE 115*  BUN 12  CREATININE 1.19  CALCIUM 9.4   LFT Recent Labs    06/30/21 2132  PROT 7.9  ALBUMIN 4.6  AST 30  ALT 49*  ALKPHOS 66  BILITOT 0.9   PT/INR No results for input(s): LABPROT, INR in the last 72 hours.  STUDIES: DG Neck Soft Tissue  Result Date:  06/30/2021 CLINICAL DATA:  Food bolus impaction EXAM: NECK SOFT TISSUES - 1+ VIEW COMPARISON:  None. FINDINGS: There is no evidence of retropharyngeal soft tissue swelling or epiglottic enlargement. The cervical airway is unremarkable and no radio-opaque foreign body identified. IMPRESSION: Negative. Electronically Signed   By: Helyn Numbers MD   On: 06/30/2021 22:28   DG Chest 2 View  Result Date: 06/30/2021 CLINICAL DATA:  Food bolus impaction EXAM: CHEST - 2 VIEW COMPARISON:  08/01/2012 FINDINGS: A rounded calcified density is seen within the right paratracheal region subjacent to the medial right clavicle likely representing hypertrophic changes at the first costochondral junction as there is no significant mass effect upon the adjacent pulmonary or tracheal structures. The lungs are otherwise clear. No pneumothorax or pleural effusion. Cardiac size within normal limits. The pulmonary vascularity is normal. No acute bone abnormality. IMPRESSION: No active cardiopulmonary disease. Electronically Signed   By: Helyn Numbers MD   On: 06/30/2021 22:27       Impression / Plan:   40 y/o gentleman with food impaction from beef  - plan for EGD tonight - NPO - further recs after procedure  Merlyn Lot MD, MPH [facility] GI

## 2021-07-01 ENCOUNTER — Encounter: Payer: Self-pay | Admitting: *Deleted

## 2021-07-01 MED ORDER — PROPOFOL 10 MG/ML IV BOLUS
INTRAVENOUS | Status: DC | PRN
  Administered 2021-07-01: 200 mg via INTRAVENOUS

## 2021-07-01 MED ORDER — SODIUM CHLORIDE 0.9 % IV SOLN: INTRAVENOUS | Status: DC

## 2021-07-01 MED ORDER — FENTANYL CITRATE (PF) 100 MCG/2ML IJ SOLN
INTRAMUSCULAR | Status: DC | PRN
  Administered 2021-07-01 (×2): 50 ug via INTRAVENOUS

## 2021-07-01 MED ORDER — LIDOCAINE HCL (CARDIAC) PF 100 MG/5ML IV SOSY
PREFILLED_SYRINGE | INTRAVENOUS | Status: DC | PRN
  Administered 2021-07-01: 100 mg via INTRAVENOUS

## 2021-07-01 MED ORDER — SUCCINYLCHOLINE CHLORIDE 20 MG/ML IJ SOLN
INTRAMUSCULAR | Status: DC | PRN
  Administered 2021-07-01: 120 mg via INTRAVENOUS

## 2021-07-01 NOTE — Anesthesia Procedure Notes (Signed)
Procedure Name: Intubation Date/Time: 07/01/2021 12:04 AM Performed by: Irving Burton, CRNA Pre-anesthesia Checklist: Patient identified, Patient being monitored, Timeout performed, Emergency Drugs available and Suction available Patient Re-evaluated:Patient Re-evaluated prior to induction Oxygen Delivery Method: Circle system utilized Preoxygenation: Pre-oxygenation with 100% oxygen Induction Type: IV induction Ventilation: Mask ventilation without difficulty Laryngoscope Size: McGraph and 4 Grade View: Grade I Tube type: Oral Tube size: 7.5 mm Number of attempts: 1 Airway Equipment and Method: Stylet Placement Confirmation: ETT inserted through vocal cords under direct vision, positive ETCO2 and breath sounds checked- equal and bilateral Secured at: 21 cm Tube secured with: Tape Dental Injury: Teeth and Oropharynx as per pre-operative assessment

## 2021-07-01 NOTE — Op Note (Signed)
[facility] Gastroenterology Patient Name: [facility] Procedure Date: 06/30/2021 11:54 PM MRN: [id number] Account #: [id number] Date of Birth: 19-Jun-1981 Admit Type: Inpatient Age: 40 Room: [facility] ENDO ROOM 4 Gender: Male Note Status: Finalized Procedure:             Upper GI endoscopy Indications:           Foreign body in the esophagus Providers:             Andrey Farmer MD, MD Referring MD:          Adrian Prows (Referring MD) Medicines:             Monitored Anesthesia Care Complications:         No immediate complications. Estimated blood loss:                         Minimal. Procedure:             Pre-Anesthesia Assessment:                        - Prior to the procedure, a History and Physical was                         performed, and patient medications and allergies were                         reviewed. The patient is competent. The risks and                         benefits of the procedure and the sedation options and                         risks were discussed with the patient. All questions                         were answered and informed consent was obtained.                         Patient identification and proposed procedure were                         verified by the physician, the nurse, the                         anesthesiologist, the anesthetist and the technician                         in the endoscopy suite. Mental Status Examination:                         alert and oriented. Airway Examination: normal                         oropharyngeal airway and neck mobility. Respiratory                         Examination: clear to auscultation. CV Examination:  normal. Prophylactic Antibiotics: The patient does not                         require prophylactic antibiotics. Prior                         Anticoagulants: The patient has taken no previous                         anticoagulant or antiplatelet  agents. ASA Grade                         Assessment: II - A patient with mild systemic disease.                         After reviewing the risks and benefits, the patient                         was deemed in satisfactory condition to undergo the                         procedure. The anesthesia plan was to use monitored                         anesthesia care (MAC). Immediately prior to                         administration of medications, the patient was                         re-assessed for adequacy to receive sedatives. The                         heart rate, respiratory rate, oxygen saturations,                         blood pressure, adequacy of pulmonary ventilation, and                         response to care were monitored throughout the                         procedure. The physical status of the patient was                         re-assessed after the procedure.                        After obtaining informed consent, the endoscope was                         passed under direct vision. Throughout the procedure,                         the patient's blood pressure, pulse, and oxygen                         saturations were monitored continuously. The Endoscope  was introduced through the mouth, and advanced to the                         second part of duodenum. The upper GI endoscopy was                         accomplished without difficulty. The patient tolerated                         the procedure well. Findings:      Food was found in the lower third of the esophagus. Removal of food was       accomplished with roth net and gently pushing food bolus into stomach.       Estimated blood loss was minimal.      A large amount of food (residue) was found in the gastric fundus.      The exam of the stomach was otherwise normal.      Localized mildly erythematous mucosa without active bleeding and with no       stigmata of bleeding was found in  the duodenal bulb.      The exam of the duodenum was otherwise normal. Impression:            - Food in the lower third of the esophagus. Removal                         was successful.                        - A large amount of food (residue) in the stomach.                        - Erythematous duodenopathy. Recommendation:        - Discharge patient to home.                        - Soft diet for 1 day.                        - Use Prilosec (omeprazole) 40 mg PO daily for 12                         weeks.                        - Repeat upper endoscopy in 12 weeks to check healing                         and for EoE biopsies.                        - Refer to a gastroenterologist at appointment to be                         scheduled. Procedure Code(s):     --- Professional ---                        [phone], Esophagogastroduodenoscopy, flexible,  transoral; with removal of foreign body(s) Diagnosis Code(s):     --- Professional ---                        H03.888K, Food in esophagus causing other injury,                         initial encounter                        T18.2XXA, Foreign body in stomach, initial encounter                        K31.89, Other diseases of stomach and duodenum                        T18.108A, Unspecified foreign body in esophagus                         causing other injury, initial encounter CPT copyright 2019 American Medical Association. All rights reserved. The codes documented in this report are preliminary and upon coder review may  be revised to meet current compliance requirements. Andrey Farmer MD, MD 07/01/2021 12:22:17 AM Number of Addenda: 0 Note Initiated On: 06/30/2021 11:54 PM Estimated Blood Loss:  Estimated blood loss was minimal.      [facility]

## 2021-07-01 NOTE — Transfer of Care (Signed)
Immediate Anesthesia Transfer of Care Note  Patient: Jeffery Cooper  Procedure(s) Performed: ESOPHAGOGASTRODUODENOSCOPY (EGD)  Patient Location: PACU  Anesthesia Type:General  Level of Consciousness: drowsy  Airway & Oxygen Therapy: Patient Spontanous Breathing and Patient connected to face mask oxygen  Post-op Assessment: Report given to RN and Post -op Vital signs reviewed and stable  Post vital signs: Reviewed and stable  Last Vitals:  Vitals Value Taken Time  BP 143/93   Temp    Pulse 84   Resp 17   SpO2 100     Last Pain:  Vitals:   06/30/21 2356  TempSrc: Temporal  PainSc:          Complications: No notable events documented.

## 2021-07-01 NOTE — Anesthesia Postprocedure Evaluation (Signed)
Anesthesia Post Note  Patient: Jeffery Cooper  Procedure(s) Performed: ESOPHAGOGASTRODUODENOSCOPY (EGD)  Patient location during evaluation: PACU Anesthesia Type: General Level of consciousness: awake and alert Pain management: pain level controlled Vital Signs Assessment: post-procedure vital signs reviewed and stable Respiratory status: spontaneous breathing, nonlabored ventilation, respiratory function stable and patient connected to nasal cannula oxygen Cardiovascular status: blood pressure returned to baseline and stable Postop Assessment: no apparent nausea or vomiting Anesthetic complications: no   No notable events documented.   Last Vitals:  Vitals:   07/01/21 0030 07/01/21 0045  BP: 129/85 118/85  Pulse: 87 85  Resp: (!) 24 14  Temp:    SpO2: 100% 95%    Last Pain:  Vitals:   07/01/21 0045  TempSrc:   PainSc: 0-No pain                 Cleda Mccreedy Sabrina Keough

## 2021-12-16 ENCOUNTER — Emergency Department (HOSPITAL_COMMUNITY)
Admission: EM | Admit: 2021-12-16 | Discharge: 2021-12-16 | Disposition: A | Discharge status: Home / Self Care | Payer: 59 | Attending: Emergency Medicine | Admitting: Emergency Medicine

## 2021-12-16 ENCOUNTER — Emergency Department (HOSPITAL_COMMUNITY): Payer: 59

## 2021-12-16 ENCOUNTER — Encounter (HOSPITAL_COMMUNITY): Payer: Self-pay

## 2021-12-16 ENCOUNTER — Other Ambulatory Visit: Payer: Self-pay

## 2021-12-16 DIAGNOSIS — R569 Unspecified convulsions: Secondary | ICD-10-CM | POA: Diagnosis not present

## 2021-12-16 DIAGNOSIS — R519 Headache, unspecified: Secondary | ICD-10-CM | POA: Diagnosis not present

## 2021-12-16 LAB — RAPID URINE DRUG SCREEN, HOSP PERFORMED
Amphetamines: NOT DETECTED
Barbiturates: NOT DETECTED
Benzodiazepines: NOT DETECTED
Cocaine: NOT DETECTED
Opiates: NOT DETECTED
Tetrahydrocannabinol: NOT DETECTED

## 2021-12-16 LAB — ETHANOL: Alcohol, Ethyl (B): <10 mg/dL (ref <10)

## 2021-12-16 LAB — COMPREHENSIVE METABOLIC PANEL
ALT: 49 U/L — ABNORMAL HIGH (ref 0–44)
AST: 29 U/L (ref 15–41)
Albumin: 3.9 g/dL (ref 3.5–5.0)
Alkaline Phosphatase: 65 U/L (ref 38–126)
Anion gap: 7 (ref 5–15)
BUN: 12 mg/dL (ref 6–20)
CO2: 25 mmol/L (ref 22–32)
Calcium: 9.2 mg/dL (ref 8.9–10.3)
Chloride: 102 mmol/L (ref 98–111)
Creatinine, Ser: 1.12 mg/dL (ref 0.61–1.24)
GFR, Estimated: >60 mL/min (ref >60)
Glucose, Bld: 126 mg/dL — ABNORMAL HIGH (ref 70–99)
Potassium: 3.8 mmol/L (ref 3.5–5.1)
Sodium: 134 mmol/L — ABNORMAL LOW (ref 135–145)
Total Bilirubin: 0.6 mg/dL (ref 0.3–1.2)
Total Protein: 6.7 g/dL (ref 6.5–8.1)

## 2021-12-16 LAB — CBC
HCT: 42.5 % (ref 39.0–52.0)
Hemoglobin: 15 g/dL (ref 13.0–17.0)
MCH: 31.3 pg (ref 26.0–34.0)
MCHC: 35.3 g/dL (ref 30.0–36.0)
MCV: 88.7 fL (ref 80.0–100.0)
Platelets: 294 10*3/uL (ref 150–400)
RBC: 4.79 MIL/uL (ref 4.22–5.81)
RDW: 12.4 % (ref 11.5–15.5)
WBC: 6.3 10*3/uL (ref 4.0–10.5)
nRBC: 0 % (ref 0.0–0.2)

## 2021-12-16 LAB — CBG MONITORING, ED: Glucose-Capillary: 122 mg/dL — ABNORMAL HIGH (ref 70–99)

## 2021-12-16 MED ORDER — GADOBUTROL 1 MMOL/ML IV SOLN
10.0000 mL | Freq: Once | INTRAVENOUS | Status: AC | PRN
  Administered 2021-12-16: 10 mL via INTRAVENOUS

## 2021-12-16 NOTE — ED Triage Notes (Signed)
Pt BIB EMS from work for [employer]. Coworkers witnessed pt "tense up" for about 4 minutes. Pt was lowered to the ground and did not hit his head. Pt doesn't remember events and denies hx of sz.  A/Ox4 124/70 HR 82 CBG 117

## 2021-12-16 NOTE — ED Notes (Signed)
Patient transported to MRI 

## 2021-12-16 NOTE — ED Provider Notes (Signed)
[facility] EMERGENCY DEPARTMENT Provider Note   CSN: [id number] Arrival date & time: 12/16/21  1527     History  Chief Complaint  Patient presents with   Seizures    Jeffery Cooper is a 41 y.o. male presenting to the ED with a chief complaint of possible seizure-like activity.  States that he was at work when he was witnessed to "tensed up" this lasted about 4 minutes.  Denies any head injury.  Patient does not remember what happened other than being at work.  States that this not happened to him before.  No known history of seizures.  He denies any prior headache or trauma.  Currently experiencing a headache.  Denies any numbness, vision changes, vomiting, neck pain, changes to gait, chest pain, shortness of breath, vomiting or diarrhea.  No alcohol or drug use.   Seizures     Home Medications Prior to Admission medications   Medication Sig Start Date End Date Taking? Authorizing Provider  cetirizine (ZYRTEC) 10 MG tablet Take 10 mg by mouth every morning.   Yes [provider]  vitamin B-12 (CYANOCOBALAMIN) 1000 MCG tablet Take 1,000 mcg by mouth every morning.   Yes [provider]      Allergies    Mustard seed, Chocolate, and Peanut-containing drug products    Review of Systems   Review of Systems  Constitutional:  Negative for appetite change, chills and fever.  HENT:  Negative for ear pain, rhinorrhea, sneezing and sore throat.   Eyes:  Negative for photophobia and visual disturbance.  Respiratory:  Negative for cough, chest tightness, shortness of breath and wheezing.   Cardiovascular:  Negative for chest pain and palpitations.  Gastrointestinal:  Negative for abdominal pain, blood in stool, constipation, diarrhea, nausea and vomiting.  Genitourinary:  Negative for dysuria, hematuria and urgency.  Musculoskeletal:  Negative for myalgias.  Skin:  Negative for rash.  Neurological:  Positive for seizures (possible) and headaches.  Negative for dizziness, weakness and light-headedness.   Physical Exam Updated Vital Signs BP 114/72    Pulse 83    Temp 98.2 F (36.8 C) (Oral)    Resp 18    Ht 5\' 11"  (1.803 m)    Wt 114 kg    SpO2 94%    BMI 35.05 kg/m  Physical Exam Vitals and nursing note reviewed.  Constitutional:      General: He is not in acute distress.    Appearance: He is well-developed.  HENT:     Head: Normocephalic and atraumatic.     Nose: Nose normal.  Eyes:     General: No scleral icterus.       Right eye: No discharge.        Left eye: No discharge.     Conjunctiva/sclera: Conjunctivae normal.     Pupils: Pupils are equal, round, and reactive to light.  Cardiovascular:     Rate and Rhythm: Normal rate and regular rhythm.     Heart sounds: Normal heart sounds. No murmur heard.   No friction rub. No gallop.  Pulmonary:     Effort: Pulmonary effort is normal. No respiratory distress.     Breath sounds: Normal breath sounds.  Abdominal:     General: Bowel sounds are normal. There is no distension.     Palpations: Abdomen is soft.     Tenderness: There is no abdominal tenderness. There is no guarding.  Musculoskeletal:        General: Normal range of  motion.     Cervical back: Normal range of motion and neck supple.     Comments: No tenderness of the cervical spine at the midline.  Skin:    General: Skin is warm and dry.     Findings: No rash.  Neurological:     Mental Status: He is alert and oriented to person, place, and time.     Cranial Nerves: No cranial nerve deficit.     Sensory: No sensory deficit.     Motor: No weakness or abnormal muscle tone.     Coordination: Coordination normal.     Comments: Pupils reactive. No facial asymmetry noted. Cranial nerves appear grossly intact. Sensation intact to light touch on face, BUE and BLE. Strength 5/5 in BUE and BLE.    ED Results / Procedures / Treatments   Labs (all labs ordered are listed, but only abnormal results are  displayed) Labs Reviewed  COMPREHENSIVE METABOLIC PANEL - Abnormal; Notable for the following components:      Result Value   Sodium 134 (*)    Glucose, Bld 126 (*)    ALT 49 (*)    All other components within normal limits  CBG MONITORING, ED - Abnormal; Notable for the following components:   Glucose-Capillary 122 (*)    All other components within normal limits  CBC  ETHANOL  RAPID URINE DRUG SCREEN, HOSP PERFORMED    EKG EKG Interpretation  Date/Time:  Friday December 16 2021 15:33:29 EST Ventricular Rate:  97 PR Interval:  152 QRS Duration: 92 QT Interval:  360 QTC Calculation: 458 R Axis:   17 Text Interpretation: Sinus rhythm No significant change since last tracing Confirmed by Isla Pence [phone]) on 12/16/2021 6:08:45 PM  Radiology CT HEAD WO CONTRAST (5MM)  Result Date: 12/16/2021 CLINICAL DATA:  Seizure, new-onset, history of trauma EXAM: CT HEAD WITHOUT CONTRAST TECHNIQUE: Contiguous axial images were obtained from the base of the skull through the vertex without intravenous contrast. COMPARISON:  MRI November 16, 21. FINDINGS: Brain: No evidence of acute infarction, hemorrhage, hydrocephalus, extra-axial collection or mass lesion/mass effect. Vascular: No hyperdense vessel identified. Skull: No acute fracture. Sinuses/Orbits: Evidence of prior endoscopic sinus surgery. Mild paranasal sinus mucosal thickening. Unremarkable orbits. Other: No mastoid effusions. IMPRESSION: No evidence of acute intracranial abnormality. Electronically Signed   By: Margaretha Sheffield M.D.   On: 12/16/2021 17:42    Procedures Procedures    Medications Ordered in ED Medications - No data to display  ED Course/ Medical Decision Making/ A&P Clinical Course as of 12/16/21 1853  Fri Dec 16, 2021  1553 Hemoglobin: 15.0 [HK]  1638 Sodium(!): 134 [HK]  1709 Alcohol, Ethyl (B): <10 [HK]  1709 Glucose(!): 126 [HK]  1709 Creatinine: 1.12 [HK]  1745 CT HEAD WO CONTRAST (5MM) No acute  findings. [HK]  1835 Urine rapid drug screen (hosp performed) Negative.  [HK]    Clinical Course User Index [HK] Delia Heady, PA-C                           Medical Decision Making  41 year old male presenting to the ED for possible seizure-like activity at work.  No history of seizures in the past.  Not currently on antiepileptic medication.  No history of drug or alcohol use.  States that he does not remember the event but is currently experiencing a headache.  No neurological deficits, denies any numbness, weakness, blurry vision, vomiting.  On exam patient is alert  and oriented.  Vital signs within normal limits.  CBG is normal here.  Will obtain labs, imaging and reassess.  CT of the head is unremarkable.  CBG, CBC, CMP and ethanol level are unremarkable.  UDS is pending.  He remains hemodynamically stable.  Will consult neurology for their recommendation and observe here while we wait on remainder of labs and consultation.  [facility] neurology, Dr. Quinn Axe.  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  She recommended MRI of the brain with and without contrast.  She states that if this is unremarkable he can follow-up with neurology outpatient for an EEG as well as advised driving restrictions for 6 months.  Will update patient on plan.    Portions of this note were generated with [employer]obby[employer]. Dictation errors may occur despite best attempts at proofreading.        Final Clinical Impression(s) / ED Diagnoses Final diagnoses:  Seizure-like activity ([place])    Rx / DC Orders ED Discharge Orders          Ordered    Ambulatory referral to Neurology       Comments: An appointment is requested in approximately: 1 week   12/16/21 1824              Delia Heady, PA-C 12/16/21 Dortha Schwalbe, MD 12/16/21 2316

## 2021-12-16 NOTE — ED Notes (Signed)
Pt back from MRI, pt able to stand on his own without any assistance to use the urinal

## 2021-12-16 NOTE — ED Notes (Signed)
RN notified by EDP that  she is not done talking to the pt for discharge

## 2021-12-16 NOTE — ED Notes (Signed)
Patient transported to CT 

## 2021-12-16 NOTE — ED Notes (Signed)
Pt now reporting a HA that started when he woke up from sz.

## 2021-12-16 NOTE — ED Notes (Signed)
Pt complaining of soreness all over his body

## 2021-12-16 NOTE — Discharge Instructions (Signed)
You will not be able to drive for 6 months due to your possible seizure-like activity. You will need to follow-up outpatient with your neurologist for EEG. Return to the ER anytime if you start to have worsening symptoms, another seizure, loss of consciousness, severe headache or blurry vision.

## 2022-02-27 ENCOUNTER — Ambulatory Visit: Payer: 59 | Attending: Neurology

## 2022-02-27 DIAGNOSIS — G4733 Obstructive sleep apnea (adult) (pediatric): Secondary | ICD-10-CM | POA: Diagnosis present

## 2022-02-28 ENCOUNTER — Ambulatory Visit: Payer: 59 | Attending: Neurology

## 2022-02-28 DIAGNOSIS — G4710 Hypersomnia, unspecified: Secondary | ICD-10-CM | POA: Diagnosis not present

## 2022-02-28 DIAGNOSIS — G4733 Obstructive sleep apnea (adult) (pediatric): Secondary | ICD-10-CM | POA: Diagnosis present

## 2022-03-03 ENCOUNTER — Other Ambulatory Visit: Payer: Self-pay

## 2022-03-31 ENCOUNTER — Emergency Department: Payer: 59

## 2022-03-31 ENCOUNTER — Other Ambulatory Visit: Payer: Self-pay

## 2022-03-31 ENCOUNTER — Emergency Department
Admission: EM | Admit: 2022-03-31 | Discharge: 2022-03-31 | Disposition: A | Discharge status: Home / Self Care | Payer: 59 | Attending: Emergency Medicine | Admitting: Emergency Medicine

## 2022-03-31 ENCOUNTER — Encounter: Payer: Self-pay | Admitting: Emergency Medicine

## 2022-03-31 DIAGNOSIS — Z91010 Allergy to peanuts: Secondary | ICD-10-CM | POA: Diagnosis not present

## 2022-03-31 DIAGNOSIS — R32 Unspecified urinary incontinence: Secondary | ICD-10-CM | POA: Diagnosis not present

## 2022-03-31 DIAGNOSIS — R569 Unspecified convulsions: Secondary | ICD-10-CM | POA: Diagnosis present

## 2022-03-31 DIAGNOSIS — Z20822 Contact with and (suspected) exposure to covid-19: Secondary | ICD-10-CM | POA: Diagnosis not present

## 2022-03-31 DIAGNOSIS — R944 Abnormal results of kidney function studies: Secondary | ICD-10-CM | POA: Insufficient documentation

## 2022-03-31 DIAGNOSIS — J069 Acute upper respiratory infection, unspecified: Secondary | ICD-10-CM | POA: Diagnosis not present

## 2022-03-31 HISTORY — DX: Unspecified convulsions: R56.9

## 2022-03-31 LAB — CBC WITH DIFFERENTIAL/PLATELET
Abs Immature Granulocytes: 0.02 10*3/uL (ref 0.00–0.07)
Basophils Absolute: 0.1 10*3/uL (ref 0.0–0.1)
Basophils Relative: 2 %
Eosinophils Absolute: 0.5 10*3/uL (ref 0.0–0.5)
Eosinophils Relative: 8 %
HCT: 45.6 % (ref 39.0–52.0)
Hemoglobin: 15.8 g/dL (ref 13.0–17.0)
Immature Granulocytes: 0 %
Lymphocytes Relative: 39 %
Lymphs Abs: 2.3 10*3/uL (ref 0.7–4.0)
MCH: 31.2 pg (ref 26.0–34.0)
MCHC: 34.6 g/dL (ref 30.0–36.0)
MCV: 90.1 fL (ref 80.0–100.0)
Monocytes Absolute: 0.5 10*3/uL (ref 0.1–1.0)
Monocytes Relative: 8 %
Neutro Abs: 2.5 10*3/uL (ref 1.7–7.7)
Neutrophils Relative %: 43 %
Platelets: 330 10*3/uL (ref 150–400)
RBC: 5.06 MIL/uL (ref 4.22–5.81)
RDW: 12.7 % (ref 11.5–15.5)
WBC: 5.8 10*3/uL (ref 4.0–10.5)
nRBC: 0 % (ref 0.0–0.2)

## 2022-03-31 LAB — COMPREHENSIVE METABOLIC PANEL
ALT: 45 U/L — ABNORMAL HIGH (ref 0–44)
AST: 29 U/L (ref 15–41)
Albumin: 4.2 g/dL (ref 3.5–5.0)
Alkaline Phosphatase: 69 U/L (ref 38–126)
Anion gap: 6 (ref 5–15)
BUN: 11 mg/dL (ref 6–20)
CO2: 25 mmol/L (ref 22–32)
Calcium: 8.9 mg/dL (ref 8.9–10.3)
Chloride: 108 mmol/L (ref 98–111)
Creatinine, Ser: 1.25 mg/dL — ABNORMAL HIGH (ref 0.61–1.24)
GFR, Estimated: >60 mL/min (ref >60)
Glucose, Bld: 136 mg/dL — ABNORMAL HIGH (ref 70–99)
Potassium: 3.7 mmol/L (ref 3.5–5.1)
Sodium: 139 mmol/L (ref 135–145)
Total Bilirubin: 0.7 mg/dL (ref 0.3–1.2)
Total Protein: 7.4 g/dL (ref 6.5–8.1)

## 2022-03-31 LAB — RESP PANEL BY RT-PCR (FLU A&B, COVID) ARPGX2
Influenza A by PCR: NEGATIVE
Influenza B by PCR: NEGATIVE
SARS Coronavirus 2 by RT PCR: NEGATIVE

## 2022-03-31 LAB — ETHANOL: Alcohol, Ethyl (B): <10 mg/dL (ref <10)

## 2022-03-31 MED ORDER — SODIUM CHLORIDE 0.9 % IV BOLUS
1000.0000 mL | Freq: Once | INTRAVENOUS | Status: AC
  Administered 2022-03-31: 1000 mL via INTRAVENOUS

## 2022-03-31 MED ORDER — LORAZEPAM 2 MG/ML IJ SOLN
0.5000 mg | Freq: Once | INTRAMUSCULAR | Status: AC
  Administered 2022-03-31: 0.5 mg via INTRAVENOUS
  Filled 2022-03-31: qty 1

## 2022-03-31 MED ORDER — TOPIRAMATE 25 MG PO TABS: 25.0000 mg | ORAL_TABLET | Freq: Every day | ORAL | 0 refills | Status: AC

## 2022-03-31 MED ORDER — SODIUM CHLORIDE 0.9 % IV SOLN
500.0000 mg | Freq: Once | INTRAVENOUS | Status: AC
  Administered 2022-03-31: 500 mg via INTRAVENOUS
  Filled 2022-03-31: qty 5

## 2022-03-31 MED ORDER — AZITHROMYCIN 250 MG PO TABS: 250.0000 mg | ORAL_TABLET | Freq: Every day | ORAL | 0 refills | Status: AC

## 2022-03-31 MED ORDER — TOPIRAMATE 25 MG PO TABS
25.0000 mg | ORAL_TABLET | Freq: Once | ORAL | Status: AC
  Administered 2022-03-31: 25 mg via ORAL
  Filled 2022-03-31: qty 1

## 2022-03-31 NOTE — ED Notes (Addendum)
Seizure pads placed on bed, bed in lowest position with siderails up; call bell in reach, yankauer suction at head of bed and wife at bedside ?

## 2022-03-31 NOTE — ED Provider Notes (Signed)
? ?[facility] ?Provider Note ? ? ? Event Date/Time  ? First MD Initiated Contact with Patient 03/31/22 0358   ?  (approximate) ? ? ?History  ? ?Seizures ? ? ?HPI ? ?Jeffery FileJustin D Cooper is a 41 y.o. male brought to the ED from home by his wife status post seizure.  Patient has a history of seizures as well as family history.  Takes Topamax 50 mg nightly; reports compliance.  Wife awoke to patient having a tonic-clonic seizure in the bed lasting approximately 15 minutes with urinary incontinence.  Last seizure in January.  Patient denies recent fever, chest pain, shortness of breath, abdominal pain, nausea or vomiting.  Wife has noted a cough and fatigue.  Denies drugs or alcohol use. ?  ? ? ?Past Medical History  ? ?Past Medical History:  ?Diagnosis Date  ? Seizures (HCC)   ? ? ? ?Active Problem List  ?There are no problems to display for this patient. ? ? ? ?Past Surgical History  ? ?Past Surgical History:  ?Procedure Laterality Date  ? ESOPHAGOGASTRODUODENOSCOPY N/A 06/30/2021  ? Procedure: ESOPHAGOGASTRODUODENOSCOPY (EGD);  Surgeon: Regis BillLocklear, Cameron T, MD;  [facility] END[facility];  Service: Endoscopy;  Laterality: N/A;  ? ? ? ?Home Medications  ? ?Prior to Admission medications   ?Medication Sig Start Date End Date Taking? Authorizing Provider  ?azithromycin (ZITHROMAX) 250 MG tablet Take 1 tablet (250 mg total) by mouth daily. 03/31/22  Yes Irean HongSung, Amere Iott J, MD  ?topiramate (TOPAMAX) 25 MG tablet Take 1 tablet (25 mg total) by mouth at bedtime. 03/31/22  Yes Irean HongSung, Darya Bigler J, MD  ?cetirizine (ZYRTEC) 10 MG tablet Take 10 mg by mouth every morning.    [provider]  ?vitamin B-12 (CYANOCOBALAMIN) 1000 MCG tablet Take 1,000 mcg by mouth every morning.    [provider]  ? ? ? ?Allergies  ?Mustard seed, Chocolate, and Peanut-containing drug products ? ? ?Family History  ? ?Family History  ?Problem Relation Age of Onset  ? Clotting disorder Mother   ? Hypertension Father   ? Stroke  Father   ? Heart attack Father   ? ? ? ?Physical Exam  ?Triage Vital Signs: ?ED Triage Vitals  ?Enc Vitals Group  ?   BP   ?   Pulse   ?   Resp   ?   Temp   ?   Temp src   ?   SpO2   ?   Weight   ?   Height   ?   Head Circumference   ?   Peak Flow   ?   Pain Score   ?   Pain Loc   ?   Pain Edu?   ?   Excl. in GC?   ? ? ?Updated Vital Signs: ?BP 114/86   Pulse 73   Temp 97.9 ?F (36.6 ?C) (Oral)   Resp 18   Ht 5\' 10"  (1.778 m)   Wt 113.4 kg   SpO2 98%   BMI 35.87 kg/m?  ? ? ?General: Awake, no distress.   ?CV:  RRR.  Good peripheral perfusion.  ?Resp:  Normal effort.  CTA B. ?Abd:  Nontender.  No distention.  ?Other:  Slightly sleepy but answers questions appropriately.  Alert and oriented x3.  CN II toXII grossly intact.  5/5 motor strength and sensation all extremities. MAEx4.  Did not bite tongue. ? ? ?ED Results / Procedures / Treatments  ?Labs ?(all labs ordered are listed, but only  abnormal results are displayed) ?Labs Reviewed  ?COMPREHENSIVE METABOLIC PANEL - Abnormal; Notable for the following components:  ?    Result Value  ? Glucose, Bld 136 (*)   ? Creatinine, Ser 1.25 (*)   ? ALT 45 (*)   ? All other components within normal limits  ?RESP PANEL BY RT-PCR (FLU A&B, COVID) ARPGX2  ?CBC WITH DIFFERENTIAL/PLATELET  ?ETHANOL  ?URINE DRUG SCREEN, QUALITATIVE ([facility] ONLY)  ? ? ? ?EKG ? ?ED ECG REPORT ?I, Irean Hong, the attending physician, personally viewed and interpreted this ECG. ? ? Date: 03/31/2022 ? EKG Time: 0400 ? Rate: 85 ? Rhythm: normal sinus rhythm ? Axis: Normal ? Intervals:none ? ST&T Change: Nonspecific ? ? ? ?RADIOLOGY ?I have independently visualized and interpreted patient's CT and chest x-ray as well as noted the radiology interpretation: ? ?CT head: No ICH ? ?Chest x-ray: LLL atelectasis versus pneumonia ? ?Official radiology report(s): ?CT Head Wo Contrast ? ?Result Date: 03/31/2022 ?CLINICAL DATA:  Mental status change with unknown cause EXAM: CT HEAD WITHOUT CONTRAST TECHNIQUE:  Contiguous axial images were obtained from the base of the skull through the vertex without intravenous contrast. RADIATION DOSE REDUCTION: This exam was performed according to the departmental dose-optimization program which includes automated exposure control, adjustment of the mA and/or kV according to patient size and/or use of iterative reconstruction technique. COMPARISON:  12/16/2021 FINDINGS: Brain: No evidence of acute infarction, hemorrhage, hydrocephalus, extra-axial collection or mass lesion/mass effect. Vascular: No hyperdense vessel or unexpected calcification. Skull: Normal. Negative for fracture or focal lesion. Sinuses/Orbits: No acute finding. Partially covered left maxillary antrostomy. IMPRESSION: Negative head CT. Electronically Signed   By: Tiburcio Pea M.D.   On: 03/31/2022 04:39  ? ?DG Chest Port 1 View ? ?Result Date: 03/31/2022 ?CLINICAL DATA:  Cough. EXAM: PORTABLE CHEST 1 VIEW COMPARISON:  PA Lat 06/30/2021. FINDINGS: The lungs expiratory. Heart size and vasculature are normal for expiratory technique. The mediastinum although projecting wider than previously is probably exaggerated due to this. There is asymmetric increased opacity in the hypoinflated left base which could be asymmetric atelectasis or pneumonia versus aspiration. The hypoinflated lungs are otherwise generally clear with the lower zones not well evaluated. No acute osseous abnormality. IMPRESSION: Expiratory exam with asymmetric increased opacity in the left lower zone. This could be asymmetric atelectasis or pneumonia. A follow-up study is recommended with a PA Lat views and full inspiration. Electronically Signed   By: Almira Bar M.D.   On: 03/31/2022 05:02   ? ? ?PROCEDURES: ? ?Critical Care performed: No ? ?.1-3 Lead EKG Interpretation ?Performed by: Irean Hong, MD ?Authorized by: Irean Hong, MD  ? ?  Interpretation: normal   ?  ECG rate:  90 ?  ECG rate assessment: normal   ?  Rhythm: sinus rhythm   ?   Ectopy: none   ?  Conduction: normal   ?Comments:  ?   Patient is on cardiac monitor to evaluate for arrhythmias ? ? ?MEDICATIONS ORDERED IN ED: ?Medications  ?azithromycin (ZITHROMAX) 500 mg in sodium chloride 0.9 % 250 mL IVPB (500 mg Intravenous New Bag/Given 03/31/22 0617)  ?sodium chloride 0.9 % bolus 1,000 mL (0 mLs Intravenous Stopped 03/31/22 0514)  ?LORazepam (ATIVAN) injection 0.5 mg (0.5 mg Intravenous Given 03/31/22 0416)  ?topiramate (TOPAMAX) tablet 25 mg (25 mg Oral Given 03/31/22 0657)  ? ? ? ?IMPRESSION / MDM / ASSESSMENT AND PLAN / ED COURSE  ?I reviewed the triage vital signs and the nursing notes. ?             ?               ?  41 year old male presenting with seizure.  Differential diagnosis includes but is not limited to ICH, toxicologic, infectious, metabolic etiologies, subtherapeutic Topamax level, etc.  I have personally reviewed patient's records and see his neurology office visit dated 12/22/2021. ? ?The patient is on the cardiac monitor to evaluate for evidence of arrhythmia and/or significant heart rate changes. ? ?We will obtain lab work, CT head.  Seizure pads placed to protect patient.  We will give small dose IV Ativan to prevent further seizures.  Will reassess. ? ?Clinical Course as of 03/31/22 0700  ?Fri Mar 31, 2022  ?[id number] Patient resting in no acute distress.  Updated wife of all test results including normal WBC 5.8, mildly elevated creatinine 1.25, negative EtOH.  CT head unremarkable.  Portable chest x-ray possible left lung infection.  Given that wife has noticed dry cough, will empirically cover with Azithromycin. Will increase Topamax to 75mg  nightly.  [JS]  ?  ?Clinical Course User Index ?[JS] , MD  ? ? ? ?FINAL CLINICAL IMPRESSION(S) / ED DIAGNOSES  ? ?Final diagnoses:  ?Seizure (HCC)  ?Upper respiratory tract infection, unspecified type  ? ? ? ?Rx / DC Orders  ? ?ED Discharge Orders   ? ?      Ordered  ?  azithromycin (ZITHROMAX) 250 MG tablet  Daily       ?  03/31/22 0540  ?  topiramate (TOPAMAX) 25 MG tablet  Daily at bedtime       ? 03/31/22 0544  ? ?  ?  ? ?  ? ? ? ?Note:  This document was prepared using 04/02/22 and may include unintentional di

## 2022-03-31 NOTE — ED Notes (Signed)
Pt to CT

## 2022-03-31 NOTE — Discharge Instructions (Signed)
1.  Finish antibiotic as prescribed (Azithromycin 250mg  daily x4 days). ?2.  Increase Topamax to 75mg  nightly. ?3.  No driving or operating heavy machinery until cleared by your neurologist. ?4.  Return to the ER for recurrent or worsening symptoms, persistent vomiting, difficulty breathing or other concerns. ?

## 2022-03-31 NOTE — ED Triage Notes (Signed)
Pt to room 1 via w/c accomp by wife; pt transferred self to stretcher and assisted into hosp gown; placed on card monitor; wife reports awoke to pt have tonic-clonic seizure in bed lasting approx ; was incont of urine; st recently dx with seizures and rx topamax 50mg  nightly; pt denies any c/o at present; denies any recent illness but wife reports he had c/o feeling tired yesterday ?

## 2022-03-31 NOTE — ED Notes (Signed)
Urinal placed at the bedside.  ?

## 2022-03-31 NOTE — ED Notes (Signed)
Pt back from CT

## 2022-03-31 NOTE — ED Notes (Signed)
Seizure pads placed on the bed 

## 2022-08-07 ENCOUNTER — Encounter: Payer: Self-pay | Admitting: Emergency Medicine

## 2022-08-07 DIAGNOSIS — R569 Unspecified convulsions: Secondary | ICD-10-CM | POA: Insufficient documentation

## 2022-08-07 LAB — CBC WITH DIFFERENTIAL/PLATELET
Abs Immature Granulocytes: 0.05 10*3/uL (ref 0.00–0.07)
Basophils Absolute: 0.1 10*3/uL (ref 0.0–0.1)
Basophils Relative: 1 %
Eosinophils Absolute: 0.3 10*3/uL (ref 0.0–0.5)
Eosinophils Relative: 6 %
HCT: 42.9 % (ref 39.0–52.0)
Hemoglobin: 14.8 g/dL (ref 13.0–17.0)
Immature Granulocytes: 1 %
Lymphocytes Relative: 32 %
Lymphs Abs: 1.9 10*3/uL (ref 0.7–4.0)
MCH: 31 pg (ref 26.0–34.0)
MCHC: 34.5 g/dL (ref 30.0–36.0)
MCV: 89.9 fL (ref 80.0–100.0)
Monocytes Absolute: 0.5 10*3/uL (ref 0.1–1.0)
Monocytes Relative: 9 %
Neutro Abs: 3.1 10*3/uL (ref 1.7–7.7)
Neutrophils Relative %: 51 %
Platelets: 362 10*3/uL (ref 150–400)
RBC: 4.77 MIL/uL (ref 4.22–5.81)
RDW: 12.4 % (ref 11.5–15.5)
WBC: 5.9 10*3/uL (ref 4.0–10.5)
nRBC: 0 % (ref 0.0–0.2)

## 2022-08-07 LAB — COMPREHENSIVE METABOLIC PANEL
ALT: 39 U/L (ref 0–44)
AST: 28 U/L (ref 15–41)
Albumin: 4.2 g/dL (ref 3.5–5.0)
Alkaline Phosphatase: 63 U/L (ref 38–126)
Anion gap: 8 (ref 5–15)
BUN: 17 mg/dL (ref 6–20)
CO2: 24 mmol/L (ref 22–32)
Calcium: 9.2 mg/dL (ref 8.9–10.3)
Chloride: 108 mmol/L (ref 98–111)
Creatinine, Ser: 1.06 mg/dL (ref 0.61–1.24)
GFR, Estimated: >60 mL/min (ref >60)
Glucose, Bld: 115 mg/dL — ABNORMAL HIGH (ref 70–99)
Potassium: 3.7 mmol/L (ref 3.5–5.1)
Sodium: 140 mmol/L (ref 135–145)
Total Bilirubin: 0.5 mg/dL (ref 0.3–1.2)
Total Protein: 7.5 g/dL (ref 6.5–8.1)

## 2022-08-07 LAB — TROPONIN I (HIGH SENSITIVITY): Troponin I (High Sensitivity): 3 ng/L (ref <18)

## 2022-08-07 LAB — CBG MONITORING, ED: Glucose-Capillary: 124 mg/dL — ABNORMAL HIGH (ref 70–99)

## 2022-08-07 NOTE — ED Triage Notes (Signed)
Pt presents via POV with complaints of seizure. Pt had a tonic clonic seizure tonight while in bed that lasted a "few minutes" per wife. Pt endorses fatigue but denies pain. Did not his head, nor bite his tongue. Not on Topamax for the last week due to denial with insurance. Per wife, the patient had "mini seizures" last night lasting few seconds but tonight was "more extreme". Denies Dizziness, CP or SOB.

## 2022-08-08 ENCOUNTER — Emergency Department: Payer: BC Managed Care – PPO

## 2022-08-08 ENCOUNTER — Emergency Department
Admission: EM | Admit: 2022-08-08 | Discharge: 2022-08-08 | Disposition: A | Discharge status: Home / Self Care | Payer: BC Managed Care – PPO | Attending: Emergency Medicine | Admitting: Emergency Medicine

## 2022-08-08 DIAGNOSIS — R569 Unspecified convulsions: Secondary | ICD-10-CM

## 2022-08-08 MED ORDER — TOPIRAMATE 25 MG PO TABS
50.0000 mg | ORAL_TABLET | Freq: Once | ORAL | Status: AC
  Administered 2022-08-08: 50 mg via ORAL
  Filled 2022-08-08: qty 2

## 2022-08-08 NOTE — ED Provider Notes (Signed)
[facility] Provider Note    Event Date/Time   First MD Initiated Contact with Patient 08/08/22 0302     (approximate)   History   Seizures   HPI  Jeffery Cooper is a 41 y.o. male who presents to the ED for evaluation of Seizures   I reviewed neurology clinic visit from April.  History of epilepsy.  Prescribed topiramate   Patient presents to the ED with his wife for evaluation of a witnessed seizure.  Wife reports seeing a seizure as patient was sleeping in bed tonight, about 30 seconds of generalized tonic-clonic activity.  Patient reports he did bite his tongue bilaterally.  No subsequent swelling or difficulty breathing.  No recurrence of seizures.  He reports feeling fine now.  Patient reports he has been unable to afford his antiepileptic medications due to insurance denying the claim and apparently it would cost something like $600 per month.  He has now been without his medications for 4 to 5 days.  No recent illnesses   Physical Exam   Triage Vital Signs: ED Triage Vitals  Enc Vitals Group     BP 08/07/22 2047 118/84     Pulse Rate 08/07/22 2047 99     Resp 08/07/22 2047 16     Temp 08/07/22 2047 98.4 F (36.9 C)     Temp Source 08/07/22 2047 Oral     SpO2 08/07/22 2047 94 %     Weight 08/07/22 2049 260 lb (117.9 kg)     Height 08/07/22 2049 5\' 10"  (1.778 m)     Head Circumference --      Peak Flow --      Pain Score 08/07/22 2045 0     Pain Loc --      Pain Edu? --      Excl. in GC? --     Most recent vital signs: Vitals:   08/07/22 2047 08/08/22 0404  BP: 118/84 106/68  Pulse: 99 68  Resp: 16 15  Temp: 98.4 F (36.9 C) 98.2 F (36.8 C)  SpO2: 94% 97%    General: Awake, no distress.  CV:  Good peripheral perfusion.  Resp:  Normal effort.  Abd:  No distention.  MSK:  No deformity noted.  Neuro:  No focal deficits appreciated. Cranial nerves II through XII intact 5/5 strength and sensation in all 4  extremities Other:  Small hemostatic bite marks noted to the lateral tongue without localized swelling, bleeding, erythema   ED Results / Procedures / Treatments   Labs (all labs ordered are listed, but only abnormal results are displayed) Labs Reviewed  COMPREHENSIVE METABOLIC PANEL - Abnormal; Notable for the following components:      Result Value   Glucose, Bld 115 (*)    All other components within normal limits  CBG MONITORING, ED - Abnormal; Notable for the following components:   Glucose-Capillary 124 (*)    All other components within normal limits  CBC WITH DIFFERENTIAL/PLATELET  TROPONIN I (HIGH SENSITIVITY)    EKG Sinus rhythm with a rate of 93 bpm.  Normal axis and intervals.  No clear signs of acute ischemia.  RADIOLOGY CXR interpreted by me without evidence of acute cardiopulmonary pathology.  Official radiology report(s): DG Chest 2 View  Result Date: 08/08/2022 CLINICAL DATA:  Productive cough EXAM: CHEST - 2 VIEW COMPARISON:  Eight days ago FINDINGS: Normal heart size and mediastinal contours. Low lung volumes. No acute infiltrate or edema. No effusion or pneumothorax.  No acute osseous findings. IMPRESSION: No active cardiopulmonary disease. Electronically Signed   By: Tiburcio Pea M.D.   On: 08/08/2022 04:26    PROCEDURES and INTERVENTIONS:  Procedures  Medications  topiramate (TOPAMAX) tablet 50 mg (50 mg Oral Given 08/08/22 0402)     IMPRESSION / MDM / ASSESSMENT AND PLAN / ED COURSE  I reviewed the triage vital signs and the nursing notes.  Differential diagnosis includes, but is not limited to, seizure, status epilepticus, polysubstance abuse, medication noncompliance, cardiogenic syncope   {Patient presents with symptoms of an acute illness or injury that is potentially life-threatening.  Pleasant 41 year old male presents to the ED with a witnessed seizure in the setting of inability to afford his medication regimen, ultimately suitable for  outpatient management.  Looks systemically well with reassuring examination.  Has a couple mild/superficial bite marks to his tongue, but no signs of airway obstruction, tongue bleeding, neurologic deficits or significant trauma otherwise.  Work-up is benign with a normal CBC, metabolic panel and troponin.  No cardiac dysrhythmias noted.  We provided him a dose of his typical antiepileptic medications and provide multiple pharmacy savings cards to go home with and hopefully he can afford his medications in the future.  We discussed return precautions.  Clinical Course as of 08/08/22 0523  Tue Aug 08, 2022  [id number] Reassessed.  Feeling fine.  Discussed reassuring work-up.  Provided pharmacy savings cards, discussed return precautions and neurology follow-up. [DS]    Clinical Course User Index [DS] Delton Prairie, MD     FINAL CLINICAL IMPRESSION(S) / ED DIAGNOSES   Final diagnoses:  Seizure (HCC)     Rx / DC Orders   ED Discharge Orders     None        Note:  This document was prepared using Dragon voice recognition software and may include unintentional dictation errors.   Delton Prairie, MD 08/08/22 [phone]

## 2023-04-25 DIAGNOSIS — G4733 Obstructive sleep apnea (adult) (pediatric): Secondary | ICD-10-CM | POA: Diagnosis not present

## 2023-04-25 DIAGNOSIS — G40309 Generalized idiopathic epilepsy and epileptic syndromes, not intractable, without status epilepticus: Secondary | ICD-10-CM | POA: Diagnosis not present

## 2023-04-25 DIAGNOSIS — Z79899 Other long term (current) drug therapy: Secondary | ICD-10-CM | POA: Diagnosis not present

## 2023-04-25 DIAGNOSIS — G4710 Hypersomnia, unspecified: Secondary | ICD-10-CM | POA: Diagnosis not present

## 2023-04-25 DIAGNOSIS — E569 Vitamin deficiency, unspecified: Secondary | ICD-10-CM | POA: Diagnosis not present

## 2023-05-08 DIAGNOSIS — Z6836 Body mass index (BMI) 36.0-36.9, adult: Secondary | ICD-10-CM | POA: Diagnosis not present

## 2023-05-08 DIAGNOSIS — R400 Somnolence: Secondary | ICD-10-CM | POA: Diagnosis not present

## 2023-05-08 DIAGNOSIS — E6609 Other obesity due to excess calories: Secondary | ICD-10-CM | POA: Diagnosis not present

## 2023-05-08 DIAGNOSIS — G40909 Epilepsy, unspecified, not intractable, without status epilepticus: Secondary | ICD-10-CM | POA: Diagnosis not present

## 2023-05-08 DIAGNOSIS — G8929 Other chronic pain: Secondary | ICD-10-CM | POA: Diagnosis not present

## 2023-05-08 DIAGNOSIS — G4730 Sleep apnea, unspecified: Secondary | ICD-10-CM | POA: Diagnosis not present

## 2023-05-08 DIAGNOSIS — M545 Low back pain, unspecified: Secondary | ICD-10-CM | POA: Diagnosis not present

## 2023-05-08 DIAGNOSIS — Z114 Encounter for screening for human immunodeficiency virus [HIV]: Secondary | ICD-10-CM | POA: Diagnosis not present

## 2023-05-08 DIAGNOSIS — Z0000 Encounter for general adult medical examination without abnormal findings: Secondary | ICD-10-CM | POA: Diagnosis not present

## 2023-05-08 DIAGNOSIS — E559 Vitamin D deficiency, unspecified: Secondary | ICD-10-CM | POA: Diagnosis not present

## 2023-10-24 DIAGNOSIS — I951 Orthostatic hypotension: Secondary | ICD-10-CM | POA: Diagnosis not present

## 2023-10-24 DIAGNOSIS — G4733 Obstructive sleep apnea (adult) (pediatric): Secondary | ICD-10-CM | POA: Diagnosis not present

## 2023-10-24 DIAGNOSIS — G4710 Hypersomnia, unspecified: Secondary | ICD-10-CM | POA: Diagnosis not present

## 2023-10-24 DIAGNOSIS — G40309 Generalized idiopathic epilepsy and epileptic syndromes, not intractable, without status epilepticus: Secondary | ICD-10-CM | POA: Diagnosis not present

## 2023-10-24 DIAGNOSIS — Z8669 Personal history of other diseases of the nervous system and sense organs: Secondary | ICD-10-CM | POA: Diagnosis not present
